# Patient Record
Sex: Female | Born: 1970 | Hispanic: No | Marital: Married | Smoking: Current some day smoker
Health system: Southern US, Community
[De-identification: ages and names within clinical notes are randomized; demographics above are authoritative.]

## PROBLEM LIST (undated history)

## (undated) ENCOUNTER — Inpatient Hospital Stay (HOSPITAL_COMMUNITY): Payer: Self-pay

## (undated) DIAGNOSIS — R519 Headache, unspecified: Secondary | ICD-10-CM

## (undated) DIAGNOSIS — G43909 Migraine, unspecified, not intractable, without status migrainosus: Secondary | ICD-10-CM

## (undated) DIAGNOSIS — R51 Headache: Secondary | ICD-10-CM

## (undated) DIAGNOSIS — M549 Dorsalgia, unspecified: Secondary | ICD-10-CM

## (undated) HISTORY — DX: Headache, unspecified: R51.9

## (undated) HISTORY — DX: Headache: R51

## (undated) HISTORY — PX: CHOLECYSTECTOMY: SHX55

---

## 2010-07-04 ENCOUNTER — Emergency Department (HOSPITAL_COMMUNITY)
Admission: EM | Admit: 2010-07-04 | Discharge: 2010-07-04 | Payer: Self-pay | Source: Home / Self Care | Admitting: Emergency Medicine

## 2010-10-03 LAB — CBC
Hemoglobin: 15.2 g/dL — ABNORMAL HIGH (ref 12.0–15.0)
MCH: 31.9 pg (ref 26.0–34.0)
MCHC: 33.3 g/dL (ref 30.0–36.0)
MCV: 95.8 fL (ref 78.0–100.0)
Platelets: 281 10*3/uL (ref 150–400)
RBC: 4.77 MIL/uL (ref 3.87–5.11)

## 2010-10-03 LAB — URINE MICROSCOPIC-ADD ON

## 2010-10-03 LAB — DIFFERENTIAL
Basophils Absolute: 0 10*3/uL (ref 0.0–0.1)
Basophils Relative: 0 % (ref 0–1)
Eosinophils Absolute: 0.1 10*3/uL (ref 0.0–0.7)
Lymphs Abs: 1.5 10*3/uL (ref 0.7–4.0)

## 2010-10-03 LAB — LIPASE, BLOOD: Lipase: 22 U/L (ref 11–59)

## 2010-10-03 LAB — POCT PREGNANCY, URINE: Preg Test, Ur: NEGATIVE

## 2010-10-03 LAB — URINALYSIS, ROUTINE W REFLEX MICROSCOPIC
Glucose, UA: NEGATIVE mg/dL
Urobilinogen, UA: 0.2 mg/dL (ref 0.0–1.0)

## 2010-10-03 LAB — COMPREHENSIVE METABOLIC PANEL
ALT: 18 U/L (ref 0–35)
Alkaline Phosphatase: 50 U/L (ref 39–117)
Chloride: 106 mEq/L (ref 96–112)
Creatinine, Ser: 0.75 mg/dL (ref 0.4–1.2)
GFR calc Af Amer: >60 mL/min (ref >60)
GFR calc non Af Amer: >60 mL/min (ref >60)
Potassium: 4.4 mEq/L (ref 3.5–5.1)

## 2010-10-03 LAB — URINE CULTURE
Colony Count: 60000
Culture  Setup Time: 201112140137

## 2010-10-07 ENCOUNTER — Inpatient Hospital Stay (HOSPITAL_COMMUNITY): Payer: Self-pay

## 2010-10-07 ENCOUNTER — Inpatient Hospital Stay (HOSPITAL_COMMUNITY)
Admission: AD | Admit: 2010-10-07 | Discharge: 2010-10-07 | Disposition: A | Discharge status: Home / Self Care | Payer: Self-pay | Source: Ambulatory Visit | Attending: Obstetrics & Gynecology | Admitting: Obstetrics & Gynecology

## 2010-10-07 DIAGNOSIS — O99891 Other specified diseases and conditions complicating pregnancy: Secondary | ICD-10-CM | POA: Insufficient documentation

## 2010-10-07 DIAGNOSIS — R109 Unspecified abdominal pain: Secondary | ICD-10-CM

## 2010-10-07 DIAGNOSIS — O9989 Other specified diseases and conditions complicating pregnancy, childbirth and the puerperium: Secondary | ICD-10-CM

## 2010-10-07 LAB — CBC: Hemoglobin: 14.2 g/dL (ref 12.0–15.0)

## 2010-10-07 LAB — URINE MICROSCOPIC-ADD ON

## 2010-10-07 LAB — WET PREP, GENITAL: Clue Cells Wet Prep HPF POC: NONE SEEN

## 2010-10-07 LAB — URINALYSIS, ROUTINE W REFLEX MICROSCOPIC
Glucose, UA: NEGATIVE mg/dL
Ketones, ur: NEGATIVE mg/dL
Protein, ur: NEGATIVE mg/dL
Specific Gravity, Urine: 1.02 (ref 1.005–1.030)
Urobilinogen, UA: 0.2 mg/dL (ref 0.0–1.0)
pH: 6 (ref 5.0–8.0)

## 2010-10-08 LAB — URINE CULTURE
Colony Count: 55000
Culture  Setup Time: 201203171746

## 2010-10-09 ENCOUNTER — Inpatient Hospital Stay (HOSPITAL_COMMUNITY)
Admission: AD | Admit: 2010-10-09 | Discharge: 2010-10-09 | Disposition: A | Discharge status: Home / Self Care | Payer: Self-pay | Source: Ambulatory Visit | Attending: Obstetrics and Gynecology | Admitting: Obstetrics and Gynecology

## 2010-10-09 DIAGNOSIS — O99891 Other specified diseases and conditions complicating pregnancy: Secondary | ICD-10-CM | POA: Insufficient documentation

## 2010-10-09 DIAGNOSIS — O26899 Other specified pregnancy related conditions, unspecified trimester: Secondary | ICD-10-CM

## 2010-10-09 DIAGNOSIS — R109 Unspecified abdominal pain: Secondary | ICD-10-CM | POA: Insufficient documentation

## 2010-10-09 LAB — HCG, QUANTITATIVE, PREGNANCY: hCG, Beta Chain, Quant, S: 1570 m[IU]/mL — ABNORMAL HIGH (ref <5)

## 2010-10-11 LAB — GC/CHLAMYDIA PROBE AMP, GENITAL: Chlamydia, DNA Probe: NEGATIVE

## 2010-10-16 ENCOUNTER — Ambulatory Visit (HOSPITAL_COMMUNITY)
Admit: 2010-10-16 | Discharge: 2010-10-16 | Disposition: A | Discharge status: Home / Self Care | Payer: Self-pay | Attending: Obstetrics and Gynecology | Admitting: Obstetrics and Gynecology

## 2010-10-16 ENCOUNTER — Other Ambulatory Visit (HOSPITAL_COMMUNITY): Payer: Self-pay | Admitting: Obstetrics and Gynecology

## 2010-10-16 ENCOUNTER — Inpatient Hospital Stay (HOSPITAL_COMMUNITY)
Admission: AD | Admit: 2010-10-16 | Discharge: 2010-10-16 | Disposition: A | Discharge status: Home / Self Care | Payer: Self-pay | Source: Ambulatory Visit | Attending: Obstetrics & Gynecology | Admitting: Obstetrics & Gynecology

## 2010-10-16 DIAGNOSIS — O26899 Other specified pregnancy related conditions, unspecified trimester: Secondary | ICD-10-CM

## 2010-10-16 DIAGNOSIS — O09529 Supervision of elderly multigravida, unspecified trimester: Secondary | ICD-10-CM | POA: Insufficient documentation

## 2010-10-16 DIAGNOSIS — R109 Unspecified abdominal pain: Secondary | ICD-10-CM

## 2010-10-16 DIAGNOSIS — Z3689 Encounter for other specified antenatal screening: Secondary | ICD-10-CM | POA: Insufficient documentation

## 2010-10-16 DIAGNOSIS — O99891 Other specified diseases and conditions complicating pregnancy: Secondary | ICD-10-CM | POA: Insufficient documentation

## 2010-10-16 DIAGNOSIS — O9989 Other specified diseases and conditions complicating pregnancy, childbirth and the puerperium: Secondary | ICD-10-CM

## 2010-11-23 ENCOUNTER — Encounter (HOSPITAL_COMMUNITY): Payer: Self-pay

## 2010-11-23 ENCOUNTER — Other Ambulatory Visit (HOSPITAL_COMMUNITY): Payer: Self-pay | Admitting: Obstetrics

## 2010-11-23 ENCOUNTER — Ambulatory Visit (HOSPITAL_COMMUNITY)
Admission: RE | Admit: 2010-11-23 | Discharge: 2010-11-23 | Disposition: A | Discharge status: Home / Self Care | Payer: Self-pay | Source: Ambulatory Visit | Attending: Obstetrics | Admitting: Obstetrics

## 2010-11-23 DIAGNOSIS — O3680X0 Pregnancy with inconclusive fetal viability, not applicable or unspecified: Secondary | ICD-10-CM

## 2010-11-23 DIAGNOSIS — O368390 Maternal care for abnormalities of the fetal heart rate or rhythm, unspecified trimester, not applicable or unspecified: Secondary | ICD-10-CM | POA: Insufficient documentation

## 2010-11-23 DIAGNOSIS — Z3689 Encounter for other specified antenatal screening: Secondary | ICD-10-CM | POA: Insufficient documentation

## 2010-12-07 ENCOUNTER — Other Ambulatory Visit (HOSPITAL_COMMUNITY): Payer: Self-pay | Admitting: Obstetrics

## 2010-12-08 ENCOUNTER — Encounter (HOSPITAL_COMMUNITY): Payer: Self-pay

## 2010-12-12 ENCOUNTER — Ambulatory Visit (HOSPITAL_COMMUNITY)
Admission: RE | Admit: 2010-12-12 | Discharge: 2010-12-12 | Disposition: A | Discharge status: Home / Self Care | Payer: Self-pay | Source: Ambulatory Visit | Attending: Obstetrics | Admitting: Obstetrics

## 2010-12-12 ENCOUNTER — Other Ambulatory Visit (HOSPITAL_COMMUNITY): Payer: Self-pay | Admitting: Obstetrics

## 2010-12-12 DIAGNOSIS — Z0489 Encounter for examination and observation for other specified reasons: Secondary | ICD-10-CM

## 2010-12-12 DIAGNOSIS — IMO0002 Reserved for concepts with insufficient information to code with codable children: Secondary | ICD-10-CM

## 2011-01-09 ENCOUNTER — Ambulatory Visit (HOSPITAL_COMMUNITY)
Admission: RE | Admit: 2011-01-09 | Discharge: 2011-01-09 | Disposition: A | Discharge status: Home / Self Care | Payer: Self-pay | Source: Ambulatory Visit | Attending: Obstetrics | Admitting: Obstetrics

## 2011-01-09 ENCOUNTER — Other Ambulatory Visit (HOSPITAL_COMMUNITY): Payer: Self-pay | Admitting: Obstetrics

## 2011-01-09 DIAGNOSIS — IMO0002 Reserved for concepts with insufficient information to code with codable children: Secondary | ICD-10-CM

## 2011-01-09 DIAGNOSIS — O09529 Supervision of elderly multigravida, unspecified trimester: Secondary | ICD-10-CM | POA: Insufficient documentation

## 2011-01-09 DIAGNOSIS — Z363 Encounter for antenatal screening for malformations: Secondary | ICD-10-CM | POA: Insufficient documentation

## 2011-01-09 DIAGNOSIS — Z0489 Encounter for examination and observation for other specified reasons: Secondary | ICD-10-CM

## 2011-01-09 DIAGNOSIS — Z1389 Encounter for screening for other disorder: Secondary | ICD-10-CM | POA: Insufficient documentation

## 2011-01-09 DIAGNOSIS — O99330 Smoking (tobacco) complicating pregnancy, unspecified trimester: Secondary | ICD-10-CM | POA: Insufficient documentation

## 2011-01-09 DIAGNOSIS — O34219 Maternal care for unspecified type scar from previous cesarean delivery: Secondary | ICD-10-CM | POA: Insufficient documentation

## 2011-01-09 DIAGNOSIS — O358XX0 Maternal care for other (suspected) fetal abnormality and damage, not applicable or unspecified: Secondary | ICD-10-CM | POA: Insufficient documentation

## 2011-02-06 ENCOUNTER — Ambulatory Visit (HOSPITAL_COMMUNITY)
Admission: RE | Admit: 2011-02-06 | Discharge: 2011-02-06 | Disposition: A | Discharge status: Home / Self Care | Payer: Self-pay | Source: Ambulatory Visit | Attending: Obstetrics | Admitting: Obstetrics

## 2011-02-06 ENCOUNTER — Encounter (HOSPITAL_COMMUNITY): Payer: Self-pay

## 2011-02-06 DIAGNOSIS — O34219 Maternal care for unspecified type scar from previous cesarean delivery: Secondary | ICD-10-CM | POA: Insufficient documentation

## 2011-02-06 DIAGNOSIS — O09529 Supervision of elderly multigravida, unspecified trimester: Secondary | ICD-10-CM | POA: Insufficient documentation

## 2011-02-06 DIAGNOSIS — Z363 Encounter for antenatal screening for malformations: Secondary | ICD-10-CM | POA: Insufficient documentation

## 2011-02-06 DIAGNOSIS — Z1389 Encounter for screening for other disorder: Secondary | ICD-10-CM | POA: Insufficient documentation

## 2011-02-06 DIAGNOSIS — Z0489 Encounter for examination and observation for other specified reasons: Secondary | ICD-10-CM

## 2011-02-06 DIAGNOSIS — O358XX0 Maternal care for other (suspected) fetal abnormality and damage, not applicable or unspecified: Secondary | ICD-10-CM | POA: Insufficient documentation

## 2011-02-06 DIAGNOSIS — IMO0002 Reserved for concepts with insufficient information to code with codable children: Secondary | ICD-10-CM

## 2011-02-06 DIAGNOSIS — O99330 Smoking (tobacco) complicating pregnancy, unspecified trimester: Secondary | ICD-10-CM | POA: Insufficient documentation

## 2011-09-06 ENCOUNTER — Encounter (HOSPITAL_COMMUNITY): Payer: Self-pay

## 2012-07-14 IMAGING — CT CT ABD-PELV W/ CM
1 series · 15 of 32 positions shown, 19 images · IV contrast (agent unspecified)
Comparison: None

CLINICAL DATA: Vomiting.  Stomach pain and burning.

CT ABDOMEN AND PELVIS WITH CONTRAST
TECHNIQUE: Multidetector CT imaging of the abdomen and pelvis was
performed following the standard protocol during bolus
administration of intravenous contrast.
Contrast: 100 ml of omni 300

[Series 2: rtn ap with st · axial · 0.65mm/px · z∈[-458,-32]mm · 15 of 96 slices shown, 19 images]
[im 7/96  soft-tissue]
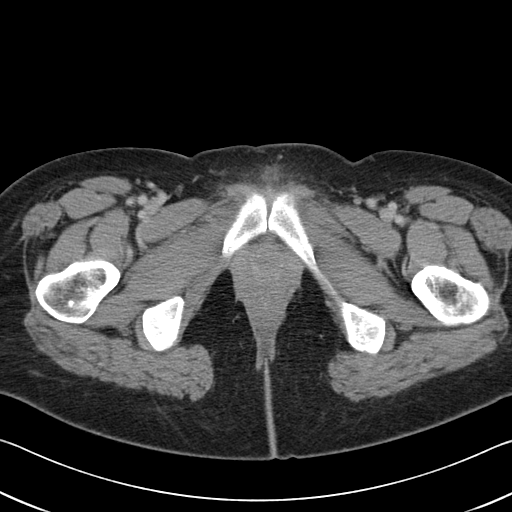
[im 7/96  bone]
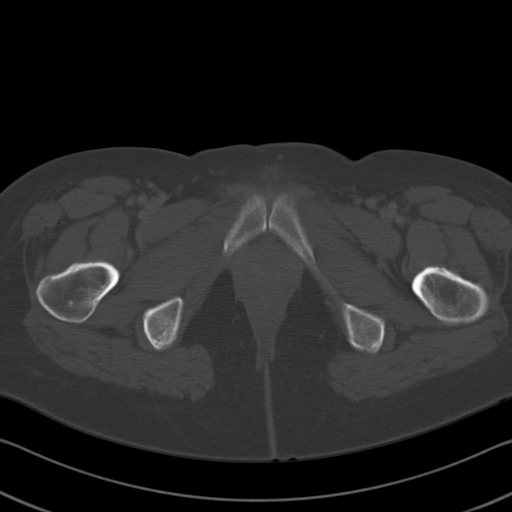
[im 13/96  soft-tissue]
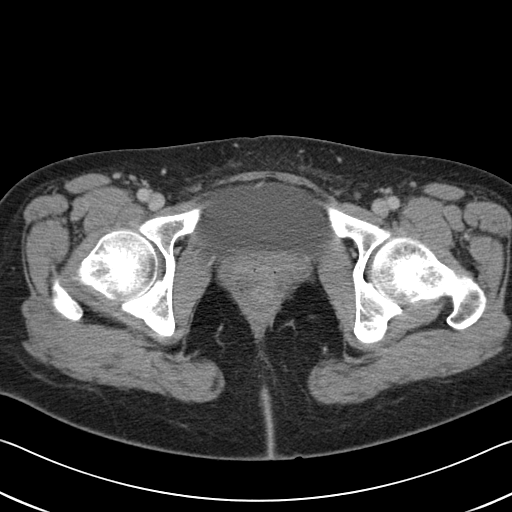
[im 19/96  soft-tissue]
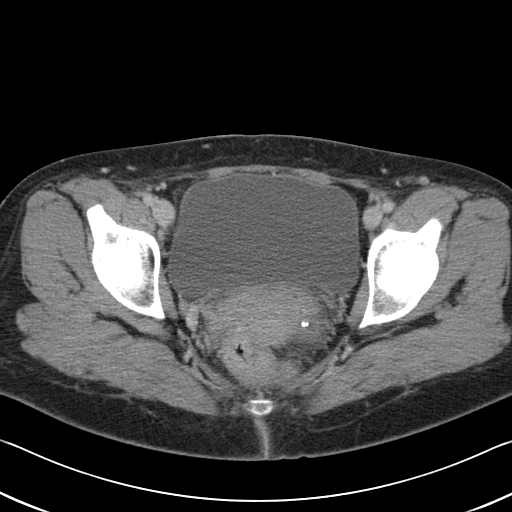
[im 28/96  soft-tissue]
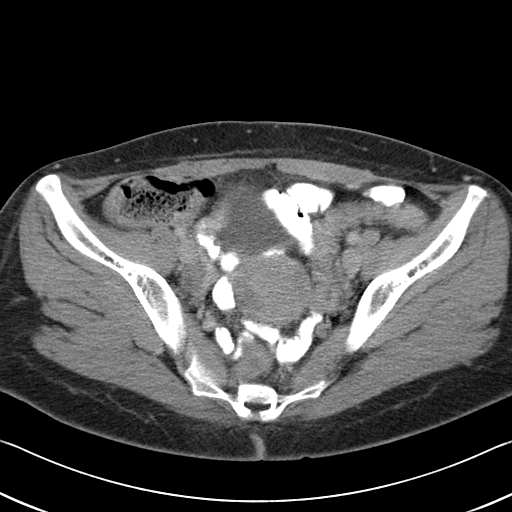
[im 34/96  soft-tissue]
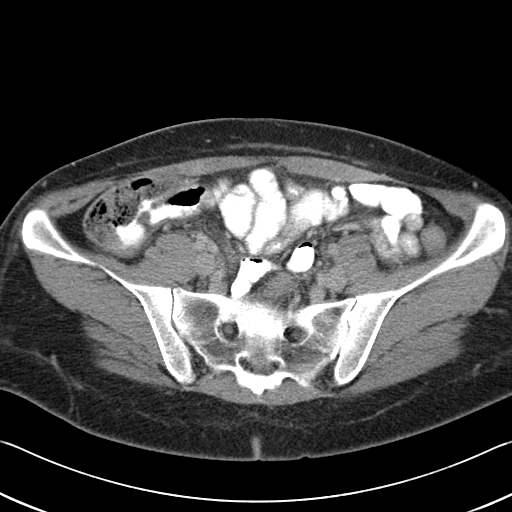
[im 40/96  soft-tissue]
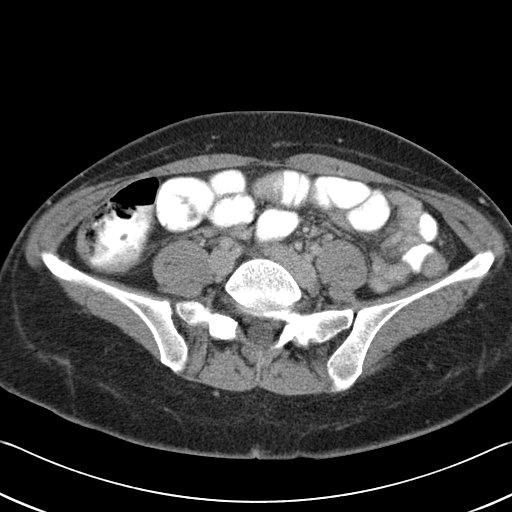
[im 50/96  soft-tissue]
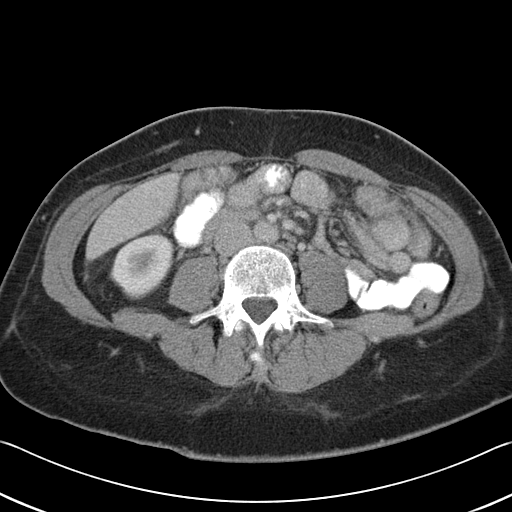
[im 56/96  soft-tissue]
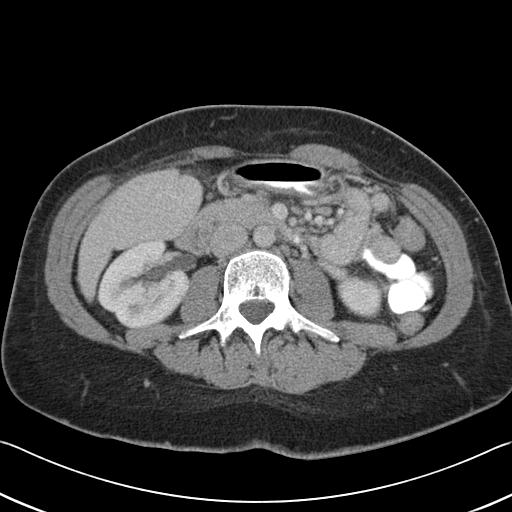
[im 62/96  soft-tissue]
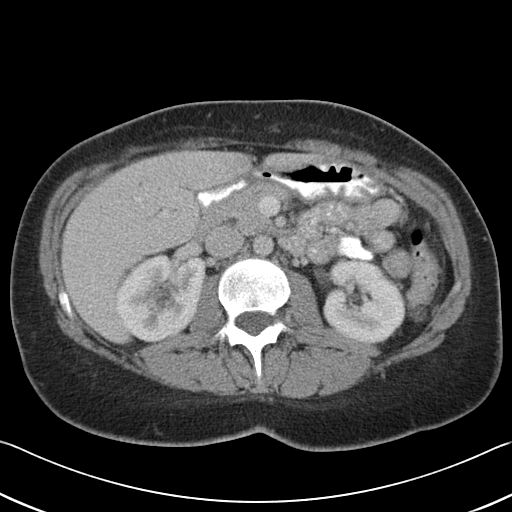
[im 62/96  bone]
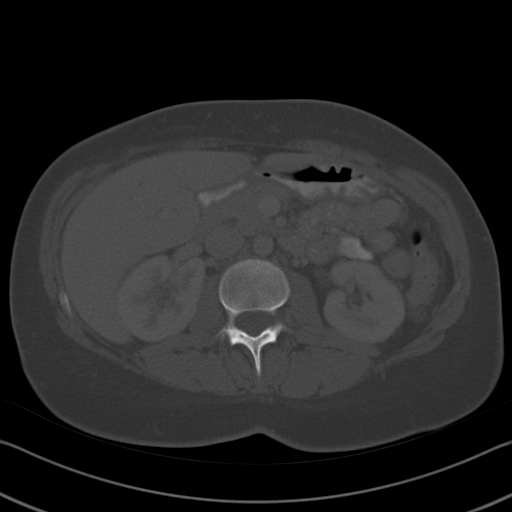
[im 68/96  soft-tissue]
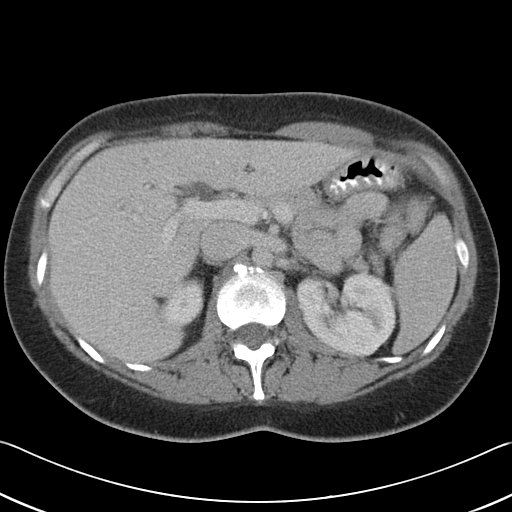
[im 77/96  soft-tissue]
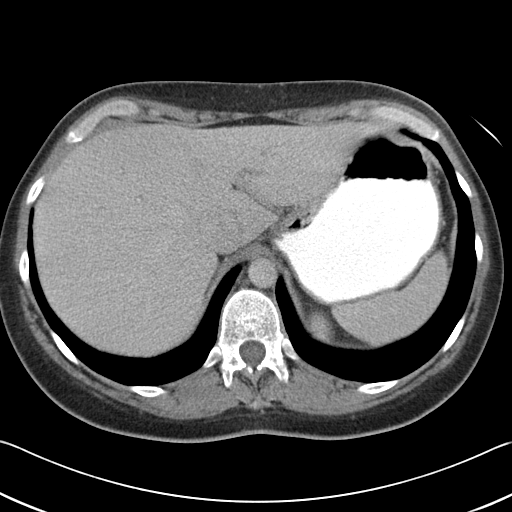
[im 83/96  soft-tissue]
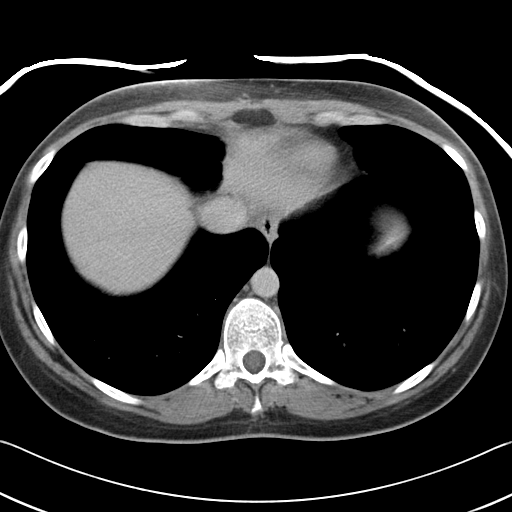
[im 83/96  lung]
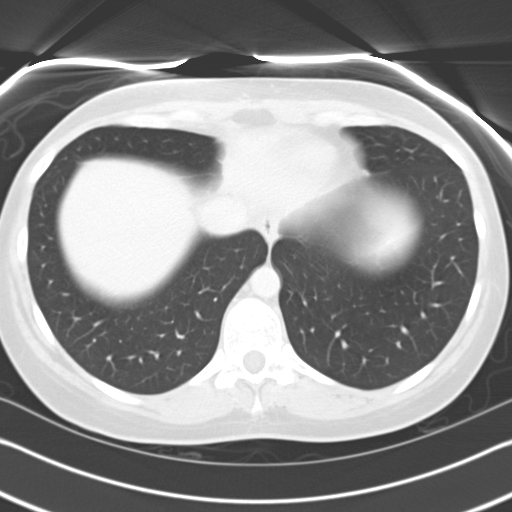
[im 86/96  lung]
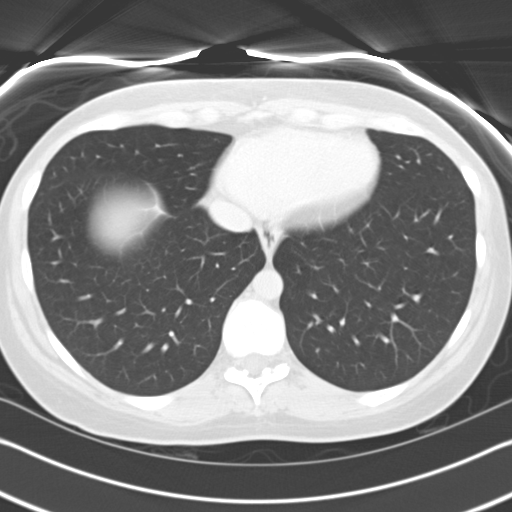
[im 89/96  soft-tissue]
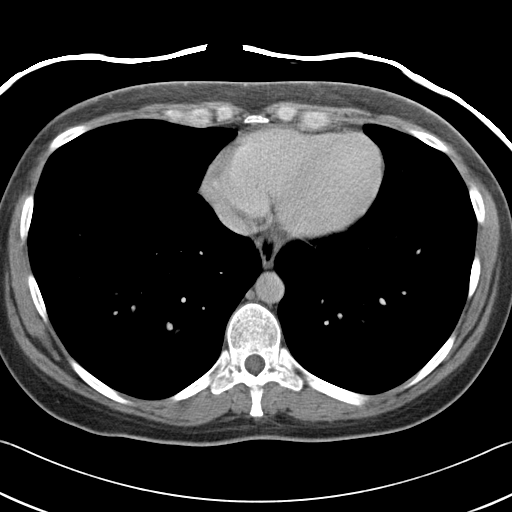
[im 89/96  lung]
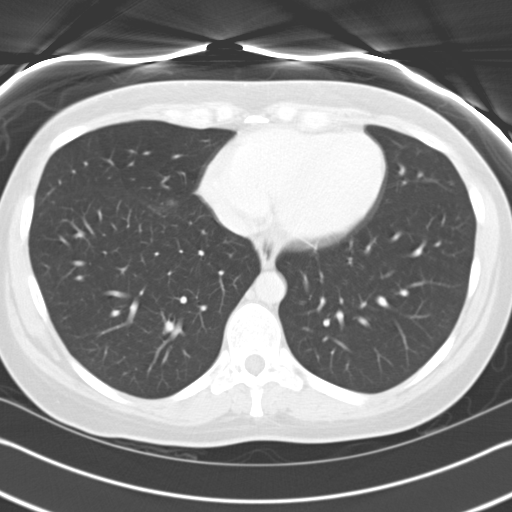
[im 92/96  lung]
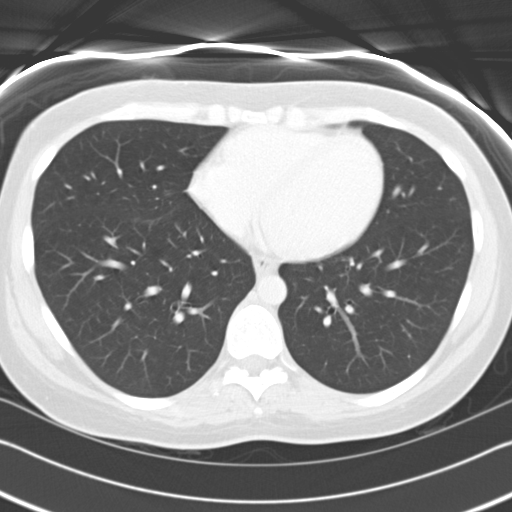

[15 of 32 positions shown; findings below may reference images not displayed]

FINDINGS: The lung bases are clear.  The spleen appears normal.  The adrenal
glands are normal.  There is no focal pancreatic abnormality.  The
patient is status post cholecystectomy.

There is mild intrahepatic biliary dilatation.  The common bile
duct measures up to 7 mm.  There is no pancreatic duct dilatation.
The pancreas appears normal.

The kidneys both appear normal.

There is no free fluid or fluid collections within the abdomen or
pelvis.

There are no enlarged lymph nodes within the upper abdomen.

There is no pelvic or inguinal adenopathy identified.

The appendix is identified and appears normal.  No peri appendiceal
fat stranding identified.  The stomach and small bowel loops appear
normal.  The colon is unremarkable.

The urinary bladder appears normal.  The uterus and adnexal
structures are unremarkable.
IMPRESSION: 1.  There is mild increased caliber of the common bile duct and
intrahepatic biliary ducts.
2.  Examination is otherwise unremarkable.

## 2013-04-06 ENCOUNTER — Ambulatory Visit: Payer: Self-pay | Admitting: Internal Medicine

## 2013-04-06 VITALS — BP 104/78 | HR 79 | Temp 98.2°F | Resp 18 | Ht 68.0 in | Wt 201.2 lb

## 2013-04-06 DIAGNOSIS — A6009 Herpesviral infection of other urogenital tract: Secondary | ICD-10-CM

## 2013-04-06 DIAGNOSIS — Z209 Contact with and (suspected) exposure to unspecified communicable disease: Secondary | ICD-10-CM

## 2013-04-06 DIAGNOSIS — M549 Dorsalgia, unspecified: Secondary | ICD-10-CM

## 2013-04-06 DIAGNOSIS — N920 Excessive and frequent menstruation with regular cycle: Secondary | ICD-10-CM

## 2013-04-06 DIAGNOSIS — N926 Irregular menstruation, unspecified: Secondary | ICD-10-CM

## 2013-04-06 LAB — POCT CBC
HCT, POC: 45.8 % (ref 37.7–47.9)
Hemoglobin: 14.7 g/dL (ref 12.2–16.2)
Lymph, poc: 2.4 (ref 0.6–3.4)
MCH, POC: 31.6 pg — AB (ref 27–31.2)
MCHC: 32.1 g/dL (ref 31.8–35.4)
WBC: 7.3 10*3/uL (ref 4.6–10.2)

## 2013-04-06 LAB — POCT UA - MICROSCOPIC ONLY
Bacteria, U Microscopic: NEGATIVE
Casts, Ur, LPF, POC: NEGATIVE
Mucus, UA: NEGATIVE

## 2013-04-06 LAB — POCT URINALYSIS DIPSTICK
Bilirubin, UA: NEGATIVE
Glucose, UA: NEGATIVE
Ketones, UA: NEGATIVE
Leukocytes, UA: NEGATIVE
pH, UA: 8

## 2013-04-06 MED ORDER — VALACYCLOVIR HCL 1 G PO TABS: ORAL_TABLET | ORAL | Status: DC

## 2013-04-06 MED ORDER — MELOXICAM 15 MG PO TABS: 15.0000 mg | ORAL_TABLET | Freq: Every day | ORAL | Status: DC

## 2013-04-06 NOTE — Patient Instructions (Signed)
Herniated Disk The bones of your spinal column (vertebrae) protect your spinal cord and nerves that go into your arms and legs. The vertebrae are separated by disks that cushion the spinal column and put space between your vertebrae. This allows movement between the vertebrae, which allows you to bend, rotate, and move your body from side to side. Sometimes, the disks move out of place (herniate) or break open (rupture) from injury or strain. The most common area for a disk herniation is in the lower back (lumbar area). Sometimes herniation occurs in the neck (cervical) disks.  CAUSES  As we grow older, the strong, fibrous cords that connect the vertebrae and support and surround the disks (ligaments) start to weaken. A strain on the back may cause a break in the disk ligaments. RISK FACTORS Herniated disks occur most often in men who are aged 18 years to 35 years, usually after strenuous activity. Other risk factors include conditions present at birth (congenital) that affect the size of the lumbar spinal canal. Additionally, a narrowing of the areas where the nerves exit the spinal canal can occur as you age. SYMPTOMS  Symptoms of a herniated disk vary. You may have weakness in certain muscles. This weakness can include difficulty lifting your leg or arm, difficulty standing on your toes on one side, or difficulty squeezing tightly with one of your hands. You may have numbness. You may feel a mild tingling, dull ache, or a burning or pulsating pain. In some cases, the pain is severe enough that you are unable to move. The pain most often occurs on one side of the body. The pain often starts slowly. It may get worse:  After you sit or stand.  At night.  When you sneeze, cough, or laugh.  When you bend backwards or walk more than a few yards. The pain, numbness, or weakness will often go away or improve a lot over a period of weeks to months. Herniated lumbar disk Symptoms of a herniated lumbar  disk may include sharp pain in one part of your leg, hip, or buttocks and numbness in other parts. You also may feel pain or numbness on the back of your calf or the top or sole of your foot. The same leg also may feel weak. Herniated cervical disk Symptoms of a herniated cervical disk may include pain when you move your neck, deep pain near or over your shoulder blade, or pain that moves to your upper arm, forearm, or fingers. DIAGNOSIS  To diagnose a herniated disk, your caregiver will perform a physical exam. Your caregiver also may perform diagnostic tests to see your disk or to test the reaction of your muscles and the function of your nerves. During the physical exam, your caregiver may ask you to:  Sit, stand, and walk. While you walk, your caregiver may ask you to try walking on your toes and then your heels.  Bend forward, backward, and sideways.  Raise your shoulders, elbow, wrist, and fingers and check your strength during these tasks. Your caregiver will check for:  Numbness or loss of feeling.  Muscle reflexes, which may be slower or missing.  Muscle strength, which may be weaker.  Posture or the way your spine curves. Diagnostic tests that may be done include:  A spinal X-ray exam to rule out other causes of back pain.  Magnetic resonance imaging (MRI) or computed tomography (CT) scan, which will show if the herniated disk is pressing on your spinal canal.  Electromyography.   This is sometimes used to identify the specific area of nerve involvement. TREATMENT  Initial treatment for a herniated disk is a short period of rest with medicines for pain. Pain medicines can include nonsteroidal anti-inflammatory medicines (NSAIDs), muscle relaxants for back spasms, and (rarely) narcotic pain medicine for severe pain that does not respond to NSAID use. Bed rest is often limited to 1 or 2 days at the most because prolonged rest can delay recovery. When the herniation involves the  lower back, sitting should be avoided as much as possible because sitting increases pressure on the ruptured disk. Sometimes a soft neck collar will be prescribed for a few days to weeks to help support your neck in the case of a cervical herniation. Physical therapy is often prescribed for patients with disk disease. Physical therapists will teach you how to properly lift, dress, walk, and perform other activities. They will work on strengthening the muscles that help support your spine. In some cases, physical therapy alone is not enough to treat a herniated disk. Steroid injections along the involved nerve root may be needed to help control pain. The steroid is injected in the area of the herniated disk and helps by reducing swelling around the disk. Sometimes surgery is the best option to treat a herniated disk.  SEEK IMMEDIATE MEDICAL CARE IF:   You have numbness, tingling, weakness, or problems with the use of your arms or legs.  You have severe headaches that are not relieved with the use of medicines.  You notice a change in your bowel or bladder control.  You have increasing pain in any areas of your body.  You experience shortness of breath, dizziness, or fainting. MAKE SURE YOU:   Understand these instructions.  Will watch your condition.  Will get help right away if you are not doing well or get worse. Document Released: 07/06/2000 Document Revised: 10/01/2011 Document Reviewed: 02/09/2011 [employer] Patient Information 2014 [employer].  

## 2013-04-06 NOTE — Progress Notes (Signed)
Subjective:    Patient ID: Connie Bryan, female    DOB: 24-Aug-1970, 42 y.o.   MRN: [id number]  HPI Has had irregular periods for 2 months. Is having increasing pelvic pain after periods. Feels nauseous and dizzy. Period 5-6 days. Goes through 1 pad every hour for first two days then 3-4 pads max.   Has never had irregular periods. Has never Bryan on any hormones.  Has felt hot. Occasional chills.   Has gained weight over the lat two years since childbirth, unable to exercise due to herniated disk.  Takes tramadol, has not given her any relief for abdominal pain.  No hematuria, dysuria, urgency. Has some frequency but attributes this to fluid intake.  Has BM every 1-2 days. Has hard stools that require straining. Uses herbal teas with good relief.  Patient has Bryan in [place] for two years, returning to [place] 1 month ago. Her husband was not with her for 9 months and she is concerned that he has had other sexual partners. One month ago she had a rash with blisters on her genital area. This was so painful she could not sit down.  Pain with intercourse. Review of Systems Had some leftover antibiotics for a UTI, took some, not sure what they were. No relief of her symptoms.    Objective:   Physical Exam  Constitutional: She appears well-developed and well-nourished.  NAD No cva tend abd-nontend Pelvic exam- external genitalia without lesions. Cervix with small amount thin white discharge, no blood, not friable. Tender with speculum insertion. Swab obtained. No CMT. Painful to palpation over ovaries bilaterally. No masses.  Recent Results (from the past 2160 hour(s))  POCT CBC     Status: Abnormal   Collection Time    04/06/13  7:53 PM      Result Value Range   WBC 7.3  4.6 - 10.2 K/uL   Lymph, poc 2.4  0.6 - 3.4   POC LYMPH PERCENT 32.3  10 - 50 %L   MID (cbc) 0.5  0 - 0.9   POC MID % 6.6  0 - 12 %M   POC Granulocyte 4.5  2 - 6.9   Granulocyte percent 61.1  37 - 80 %G   RBC  4.65  4.04 - 5.48 M/uL   Hemoglobin 14.7  12.2 - 16.2 g/dL   HCT, POC 40.9  81.1 - 47.9 %   MCV 98.6 (*) 80 - 97 fL   MCH, POC 31.6 (*) 27 - 31.2 pg   MCHC 32.1  31.8 - 35.4 g/dL   RDW, POC 91.4     Platelet Count, POC 316  142 - 424 K/uL   MPV 9.3  0 - 99.8 fL  POCT URINALYSIS DIPSTICK     Status: None   Collection Time    04/06/13  7:53 PM      Result Value Range   Color, UA yellow     Clarity, UA clear     Glucose, UA neg     Bilirubin, UA neg     Ketones, UA neg     Spec Grav, UA 1.015     Blood, UA mod     pH, UA 8.0     Protein, UA neg     Urobilinogen, UA 0.2     Nitrite, UA neg     Leukocytes, UA Negative    POCT UA - MICROSCOPIC ONLY     Status: None   Collection Time    04/06/13  7:54  PM      Result Value Range   WBC, Ur, HPF, POC neg     RBC, urine, microscopic 4-8     Bacteria, U Microscopic neg     Mucus, UA neg     Epithelial cells, urine per micros 4 - TNTC     Crystals, Ur, HPF, POC neg     Casts, Ur, LPF, POC neg     Yeast, UA neg           Assessment & Plan:  Menorrhagia - Plan: POCT CBC, POCT urinalysis dipstick, POCT UA - Microscopic Only, GC/Chlamydia Probe Amp, HIV antibody, RPR  Exposure to communicable disease - Plan: POCT CBC, POCT urinalysis dipstick, POCT UA - Microscopic Only, GC/Chlamydia Probe Amp, HIV antibody, RPR  Recent HSV #1 episode  Irregular menstrual cycle - Plan: POCT CBC, POCT urinalysis dipstick, POCT UA - Microscopic Only, GC/Chlamydia Probe Amp, HIV antibody, RPR  Chronic back pain  Meds ordered this encounter  Medications  . valACYclovir (VALTREX) 1000 MG tablet    Sig: Take twice a day for 3 days when symptoms appear.    Dispense:  6 tablet    Refill:  3  . meloxicam (MOBIC) 15 MG tablet    Sig: Take 1 tablet (15 mg total) by mouth daily.    Dispense:  30 tablet    Refill:  1   1- menorrhagia- check labs as above 2- Exposure to communicable disease- check labs as above 3- Herpes Simplex- Valtrex 100 mg po  BID at first signs of symptoms. 4-  Back pain- discussed Ultram use. Patient reports taking 2 tablets 3 times a day. Will not provide prescription at this time. Discussed physical therapy, short term prednisone use, referral to orthopedic surgery. Patient will think about this. Will try meloxicam 15 mg po qd. Provided information about herniated disk to patient. 5- Discussed CBC/UA results with patient. Will call with other results.    I fully participated in this clinical visit. I have reviewed and agree with documentation. Robert P. Merla Riches, M.D.

## 2013-04-07 LAB — HIV ANTIBODY (ROUTINE TESTING W REFLEX): HIV: NONREACTIVE

## 2013-04-07 LAB — RPR

## 2013-04-09 ENCOUNTER — Telehealth: Payer: Self-pay

## 2013-04-09 NOTE — Telephone Encounter (Signed)
Patient is calling about her lab results. She is very anxious.  [phone]

## 2013-04-09 NOTE — Telephone Encounter (Signed)
Called patient no answer. Lab has called also, left message.

## 2013-04-09 NOTE — Telephone Encounter (Signed)
Patient has called again inquiring about her lab tests. [phone].

## 2013-04-11 ENCOUNTER — Encounter: Payer: Self-pay | Admitting: *Deleted

## 2013-04-13 ENCOUNTER — Telehealth: Payer: Self-pay

## 2013-04-13 NOTE — Telephone Encounter (Signed)
Pt returned calls and notified of normal labs

## 2013-06-06 ENCOUNTER — Emergency Department (HOSPITAL_COMMUNITY)
Admission: EM | Admit: 2013-06-06 | Discharge: 2013-06-06 | Discharge status: Against Medical Advice | Payer: Medicaid Other | Attending: Emergency Medicine | Admitting: Emergency Medicine

## 2013-06-06 ENCOUNTER — Encounter (HOSPITAL_COMMUNITY): Payer: Self-pay | Admitting: Emergency Medicine

## 2013-06-06 DIAGNOSIS — R51 Headache: Secondary | ICD-10-CM | POA: Insufficient documentation

## 2013-06-06 DIAGNOSIS — R519 Headache, unspecified: Secondary | ICD-10-CM

## 2013-06-06 DIAGNOSIS — R112 Nausea with vomiting, unspecified: Secondary | ICD-10-CM | POA: Insufficient documentation

## 2013-06-06 DIAGNOSIS — F17200 Nicotine dependence, unspecified, uncomplicated: Secondary | ICD-10-CM | POA: Insufficient documentation

## 2013-06-06 DIAGNOSIS — Z791 Long term (current) use of non-steroidal anti-inflammatories (NSAID): Secondary | ICD-10-CM | POA: Insufficient documentation

## 2013-06-06 DIAGNOSIS — Z79899 Other long term (current) drug therapy: Secondary | ICD-10-CM | POA: Insufficient documentation

## 2013-06-06 LAB — COMPREHENSIVE METABOLIC PANEL
ALT: 19 U/L (ref 0–35)
Alkaline Phosphatase: 57 U/L (ref 39–117)
BUN: 14 mg/dL (ref 6–23)
CO2: 26 mEq/L (ref 19–32)
Chloride: 99 mEq/L (ref 96–112)
GFR calc Af Amer: >90 mL/min (ref >90)
Glucose, Bld: 127 mg/dL — ABNORMAL HIGH (ref 70–99)
Potassium: 4.2 mEq/L (ref 3.5–5.1)
Sodium: 136 mEq/L (ref 135–145)
Total Bilirubin: 0.4 mg/dL (ref 0.3–1.2)

## 2013-06-06 LAB — CBC WITH DIFFERENTIAL/PLATELET
Hemoglobin: 14.4 g/dL (ref 12.0–15.0)
Lymphocytes Relative: 28 % (ref 12–46)
Lymphs Abs: 3.4 10*3/uL (ref 0.7–4.0)
MCH: 32 pg (ref 26.0–34.0)
Monocytes Relative: 7 % (ref 3–12)
Neutro Abs: 7.6 10*3/uL (ref 1.7–7.7)
Neutrophils Relative %: 64 % (ref 43–77)
Platelets: 318 10*3/uL (ref 150–400)
RBC: 4.5 MIL/uL (ref 3.87–5.11)
WBC: 12 10*3/uL — ABNORMAL HIGH (ref 4.0–10.5)

## 2013-06-06 LAB — LIPASE, BLOOD: Lipase: 20 U/L (ref 11–59)

## 2013-06-06 MED ORDER — METOCLOPRAMIDE HCL 5 MG/ML IJ SOLN
10.0000 mg | Freq: Once | INTRAMUSCULAR | Status: AC
  Administered 2013-06-06: 10 mg via INTRAVENOUS
  Filled 2013-06-06: qty 2

## 2013-06-06 MED ORDER — ONDANSETRON HCL 4 MG/2ML IJ SOLN
4.0000 mg | Freq: Once | INTRAMUSCULAR | Status: AC
  Administered 2013-06-06: 4 mg via INTRAVENOUS
  Filled 2013-06-06: qty 2

## 2013-06-06 MED ORDER — DEXAMETHASONE SODIUM PHOSPHATE 10 MG/ML IJ SOLN
10.0000 mg | Freq: Once | INTRAMUSCULAR | Status: AC
  Administered 2013-06-06: 10 mg via INTRAVENOUS
  Filled 2013-06-06: qty 1

## 2013-06-06 MED ORDER — HYDROMORPHONE HCL PF 1 MG/ML IJ SOLN
1.0000 mg | Freq: Once | INTRAMUSCULAR | Status: AC
  Administered 2013-06-06: 1 mg via INTRAVENOUS
  Filled 2013-06-06: qty 1

## 2013-06-06 MED ORDER — HYDROMORPHONE HCL PF 1 MG/ML IJ SOLN: 1.0000 mg | Freq: Once | INTRAMUSCULAR | Status: DC

## 2013-06-06 MED ORDER — DIPHENHYDRAMINE HCL 50 MG/ML IJ SOLN
25.0000 mg | Freq: Once | INTRAMUSCULAR | Status: AC
  Administered 2013-06-06: 25 mg via INTRAVENOUS
  Filled 2013-06-06: qty 1

## 2013-06-06 NOTE — ED Notes (Addendum)
Pt refused CT scan.  Pain to head 7/10 pain scale. Will advise EDP.

## 2013-06-06 NOTE — ED Notes (Signed)
To room pt c/o epigastric pain and vomiting.  Onset 1 hour ago.

## 2013-06-06 NOTE — ED Provider Notes (Signed)
CSN: [id number]     Arrival date & time 06/06/13  1414 History   First MD Initiated Contact with Patient 06/06/13 1438     Chief Complaint  Patient presents with  . Abdominal Pain  . Emesis   (Consider location/radiation/quality/duration/timing/severity/associated sxs/prior Treatment) Patient is a 42 y.o. female presenting with abdominal pain and vomiting. The history is provided by the patient and the spouse. No language interpreter was used.  Abdominal Pain Pain location:  Generalized Pain quality: sharp and stabbing   Pain radiates to:  Epigastric region Pain severity:  Severe Duration:  1 hour Timing:  Constant Progression:  Worsening Relieved by:  Nothing Worsened by:  Nothing tried Ineffective treatments:  None tried Associated symptoms: nausea and vomiting   Emesis Associated symptoms: abdominal pain   Pt complains of getting a headache.  Pt report she vomited and now has severe pain in abdomen.    History reviewed. No pertinent past medical history. Past Surgical History  Procedure Laterality Date  . Cesarean section    . Cholecystectomy     History reviewed. No pertinent family history. History  Substance Use Topics  . Smoking status: Current Every Day Smoker    Types: Cigarettes  . Smokeless tobacco: Never Used  . Alcohol Use: Yes     Comment: occasional   OB History   Grav Para Term Preterm Abortions TAB SAB Ect Mult Living   4 3 3  0 0 0 0 0 0 3     Review of Systems  Gastrointestinal: Positive for nausea, vomiting and abdominal pain.  All other systems reviewed and are negative.    Allergies  Review of patient's allergies indicates no known allergies.  Home Medications   Current Outpatient Rx  Name  Route  Sig  Dispense  Refill  . acetaminophen-codeine (TYLENOL #3) 300-30 MG per tablet   Oral   Take 1 tablet by mouth every 4 (four) hours as needed.           Marland Kitchen FOLIC ACID PO   Oral   Take by mouth.           . meloxicam (MOBIC) 15 MG  tablet   Oral   Take 1 tablet (15 mg total) by mouth daily.   30 tablet   1   . Prenatal Vitamins (DIS) TABS   Oral   Take by mouth.           . traMADol (ULTRAM) 50 MG tablet   Oral   Take 50 mg by mouth every 6 (six) hours as needed for pain.         . valACYclovir (VALTREX) 1000 MG tablet      Take twice a day for 3 days when symptoms appear.   6 tablet   3    BP 111/80  Pulse 72  Resp 17  SpO2 98%  LMP 05/18/2013 Physical Exam  Nursing note and vitals reviewed. Constitutional: She appears well-developed and well-nourished.  HENT:  Head: Normocephalic.  Right Ear: External ear normal.  Left Ear: External ear normal.  Nose: Nose normal.  Mouth/Throat: Oropharynx is clear and moist.  Eyes: Conjunctivae are normal. Pupils are equal, round, and reactive to light.  Neck: Normal range of motion. Neck supple.  Cardiovascular: Normal rate.   Pulmonary/Chest: Effort normal.  Abdominal: Soft.  Musculoskeletal: Normal range of motion.  Neurological: She is alert.  Skin: Skin is warm.  Psychiatric: She has a normal mood and affect.    ED Course  Procedures (including critical care time) Labs Review Labs Reviewed - No data to display Imaging Review No results found.  EKG Interpretation   None       MDM   1. Headache       Elson Areas, PA-C 06/07/13 [phone]

## 2013-06-07 NOTE — ED Provider Notes (Signed)
Medical screening examination/treatment/procedure(s) were performed by non-physician practitioner and as supervising physician I was immediately available for consultation/collaboration.  EKG Interpretation     Ventricular Rate:  63 PR Interval:    QRS Duration: 93 QT Interval:  418 QTC Calculation: 428 R Axis:   69 Text Interpretation:  Age not entered, assumed to be  42 years old for purpose of ECG interpretation Junctional rhythm              Jarmal Lewelling B. Bernette Mayers, MD 06/07/13 1415

## 2013-06-08 ENCOUNTER — Ambulatory Visit: Payer: Self-pay | Admitting: Internal Medicine

## 2013-06-08 VITALS — BP 108/66 | HR 67 | Temp 98.6°F | Resp 18 | Ht 68.5 in | Wt 208.6 lb

## 2013-06-08 DIAGNOSIS — IMO0002 Reserved for concepts with insufficient information to code with codable children: Secondary | ICD-10-CM

## 2013-06-08 DIAGNOSIS — N920 Excessive and frequent menstruation with regular cycle: Secondary | ICD-10-CM

## 2013-06-08 DIAGNOSIS — R519 Headache, unspecified: Secondary | ICD-10-CM

## 2013-06-08 DIAGNOSIS — R110 Nausea: Secondary | ICD-10-CM

## 2013-06-08 DIAGNOSIS — R51 Headache: Secondary | ICD-10-CM

## 2013-06-08 DIAGNOSIS — M5416 Radiculopathy, lumbar region: Secondary | ICD-10-CM

## 2013-06-08 DIAGNOSIS — Z98891 History of uterine scar from previous surgery: Secondary | ICD-10-CM

## 2013-06-08 DIAGNOSIS — M47816 Spondylosis without myelopathy or radiculopathy, lumbar region: Secondary | ICD-10-CM

## 2013-06-08 MED ORDER — BUTALBITAL-APAP-CAFFEINE 50-325-40 MG PO TABS: 1.0000 | ORAL_TABLET | Freq: Two times a day (BID) | ORAL | Status: DC | PRN

## 2013-06-08 MED ORDER — TRAMADOL HCL 50 MG PO TABS: 50.0000 mg | ORAL_TABLET | Freq: Four times a day (QID) | ORAL | Status: DC

## 2013-06-08 MED ORDER — ONDANSETRON 4 MG PO TBDP: 4.0000 mg | ORAL_TABLET | Freq: Three times a day (TID) | ORAL | Status: DC | PRN
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
## 2013-06-09 ENCOUNTER — Encounter: Payer: Self-pay | Admitting: Internal Medicine

## 2013-06-09 DIAGNOSIS — M47816 Spondylosis without myelopathy or radiculopathy, lumbar region: Secondary | ICD-10-CM | POA: Insufficient documentation

## 2013-06-09 DIAGNOSIS — N920 Excessive and frequent menstruation with regular cycle: Secondary | ICD-10-CM | POA: Insufficient documentation

## 2013-06-09 DIAGNOSIS — Z98891 History of uterine scar from previous surgery: Secondary | ICD-10-CM | POA: Insufficient documentation

## 2013-08-03 ENCOUNTER — Other Ambulatory Visit: Payer: Self-pay | Admitting: Internal Medicine

## 2013-08-04 NOTE — Telephone Encounter (Signed)
Called pharm to check when RFs were p/up. First fill 11/17, then 12/5 and 12/26.

## 2013-08-04 NOTE — Telephone Encounter (Signed)
Pharmacy just filled 12/26, is that al out - when does pt have an appt for back at [facility]?

## 2013-08-11 NOTE — Telephone Encounter (Signed)
Patient hasn't heard re: prescription requested refill 08/03/13.  Pharmacy told her to call us. Refill request found with request for more information 08/04/2013.  Chart reviewed. Follow-up planned at 06/08/2013 visit is "as needed." Per the patient, she's to follow-up in 6 months because of the medications she's on, and her lack of health insurance.  Patient is taking 2 tablets every 6 hours, so 120 tabs is a 14-day supply. As such, the prescription received 11/17 with 2 refills (filled 12/05 and 12/26) is out.  She was to be referred to [facility] for additional evaluation, but hasn't received a call regarding an appointment.  Additionally, she needs to make a follow-up appointment with Dr. Laney Pastor, but has "been on hold for 30-minutes getting transferred from person to person." She's extremely frustrated.  14-day supply provided.  I will forward this message to the scheduling pool to get the appointment scheduled with Dr. Laney Pastor, the referrals staff for the referral to [facility] for back pain (lumbar radicular syndrome with lumbar spondylosis-radicular symptoms), and to Dr. Laney Pastor as Juluis Rainier.  Meds ordered this encounter  Medications  . traMADol (ULTRAM) 50 MG tablet    Sig: TAKE 1-2 TABLETS BY MOUTH 4 TIMES A DAY    Dispense:  120 tablet    Refill:  0

## 2013-08-11 NOTE — Telephone Encounter (Signed)
Patient has scheduled appointment with Dr Laney Pastor on 08/19/13 @ 1:15 pm.

## 2013-08-19 ENCOUNTER — Ambulatory Visit: Payer: Self-pay | Admitting: Internal Medicine

## 2013-08-28 ENCOUNTER — Other Ambulatory Visit: Payer: Self-pay | Admitting: Physician Assistant

## 2013-08-31 ENCOUNTER — Other Ambulatory Visit: Payer: Self-pay | Admitting: Physician Assistant

## 2013-08-31 NOTE — Telephone Encounter (Signed)
Ok but---we ref to [id number] in nov for this ---what happened

## 2013-09-01 NOTE — Telephone Encounter (Signed)
Referrals stated that they never could get a CB from Dr Nelly Laurence office. I called and was able to speak to Dr Nelly Laurence office and was advised he does not see pts w/out ins. They do have a clinic there that DOES see pts w/out ins and all records inc any MRI/imaging results, demographics, office notes need to be faxed to the same fax # [phone] Connie Bryan (who handles the clinic pts). Her phone # is [phone]. Spoke w/Dr Laney Pastor and pt and both are agreeable to get her in to either the clinic or anyone in [place] that will see her as soon as possible. Referrals, please try to schedule.

## 2013-09-24 ENCOUNTER — Other Ambulatory Visit: Payer: Self-pay | Admitting: Internal Medicine

## 2013-09-24 NOTE — Telephone Encounter (Signed)
Also left message that I asked Referrals to check into her referral to try to get her in to see someone who will see her w/out ins.

## 2013-09-24 NOTE — Telephone Encounter (Signed)
Dr Cindra Presume, I called [facility] to check status of referral. She stated that she did not get the message in Refill encounter from 08/28/13 that I forwarded to her, but will use the info provided there to try to get pt appt.

## 2013-09-24 NOTE — Telephone Encounter (Signed)
Called in and notified pt

## 2013-09-28 NOTE — Telephone Encounter (Signed)
Shredded printed Rx since I had already called in Rx.

## 2013-10-02 ENCOUNTER — Encounter (HOSPITAL_COMMUNITY): Payer: Self-pay | Admitting: General Practice

## 2013-10-02 ENCOUNTER — Inpatient Hospital Stay (HOSPITAL_COMMUNITY)
Admission: AD | Admit: 2013-10-02 | Discharge: 2013-10-02 | Disposition: A | Discharge status: Home / Self Care | Payer: Self-pay | Source: Ambulatory Visit | Attending: Family Medicine | Admitting: Family Medicine

## 2013-10-02 ENCOUNTER — Inpatient Hospital Stay (HOSPITAL_COMMUNITY): Payer: Medicaid Other

## 2013-10-02 DIAGNOSIS — N925 Other specified irregular menstruation: Secondary | ICD-10-CM

## 2013-10-02 DIAGNOSIS — R102 Pelvic and perineal pain: Secondary | ICD-10-CM

## 2013-10-02 DIAGNOSIS — N946 Dysmenorrhea, unspecified: Secondary | ICD-10-CM

## 2013-10-02 DIAGNOSIS — N949 Unspecified condition associated with female genital organs and menstrual cycle: Secondary | ICD-10-CM

## 2013-10-02 DIAGNOSIS — R109 Unspecified abdominal pain: Secondary | ICD-10-CM

## 2013-10-02 DIAGNOSIS — F17200 Nicotine dependence, unspecified, uncomplicated: Secondary | ICD-10-CM

## 2013-10-02 DIAGNOSIS — N938 Other specified abnormal uterine and vaginal bleeding: Secondary | ICD-10-CM

## 2013-10-02 LAB — CBC
HCT: 37.9 % (ref 36.0–46.0)
HEMOGLOBIN: 12.9 g/dL (ref 12.0–15.0)
MCH: 31.4 pg (ref 26.0–34.0)
MCHC: 34 g/dL (ref 30.0–36.0)
MCV: 92.2 fL (ref 78.0–100.0)
Platelets: 302 10*3/uL (ref 150–400)
RBC: 4.11 MIL/uL (ref 3.87–5.11)
RDW: 13.1 % (ref 11.5–15.5)
WBC: 8.3 10*3/uL (ref 4.0–10.5)

## 2013-10-02 LAB — URINALYSIS, ROUTINE W REFLEX MICROSCOPIC
Bilirubin Urine: NEGATIVE
GLUCOSE, UA: NEGATIVE mg/dL
Ketones, ur: 15 mg/dL — AB
Nitrite: NEGATIVE
PH: 6.5 (ref 5.0–8.0)
PROTEIN: 100 mg/dL — AB
SPECIFIC GRAVITY, URINE: 1.02 (ref 1.005–1.030)
Urobilinogen, UA: 0.2 mg/dL (ref 0.0–1.0)

## 2013-10-02 LAB — URINE MICROSCOPIC-ADD ON

## 2013-10-02 LAB — WET PREP, GENITAL
Clue Cells Wet Prep HPF POC: NONE SEEN
TRICH WET PREP: NONE SEEN
Yeast Wet Prep HPF POC: NONE SEEN

## 2013-10-02 LAB — POCT PREGNANCY, URINE: PREG TEST UR: NEGATIVE

## 2013-10-02 MED ORDER — ACETAMINOPHEN 500 MG PO TABS
1000.0000 mg | ORAL_TABLET | Freq: Once | ORAL | Status: AC
  Administered 2013-10-02: 1000 mg via ORAL
  Filled 2013-10-02: qty 2

## 2013-10-02 MED ORDER — OXYCODONE-ACETAMINOPHEN 5-325 MG PO TABS
1.0000 | ORAL_TABLET | ORAL | Status: DC | PRN
Start: 2013-10-02 — End: 2013-11-21

## 2013-10-02 NOTE — Discharge Instructions (Signed)

## 2013-10-02 NOTE — MAU Note (Addendum)
LMP 2/5. Had abd cramping this am and went to [facility] and had a lot of vag bleeding. Went on thru my day and cramping and bleeding has gotten worse. Cramping is in lower stomach and lower back.

## 2013-10-02 NOTE — MAU Provider Note (Signed)
History     CSN: [id number]  Arrival date and time: 10/02/13 7829   First Provider Initiated Contact with Patient 10/02/13 2044      Chief Complaint  Patient presents with  . Abdominal Pain  . Vaginal Bleeding   Abdominal Pain  Vaginal Bleeding Associated symptoms include abdominal pain.    Connie Bryan is a 43 y.o. [phone] who presents today with bleeding and pain. She states that she woke up this morning with vaginal bleeding that ws as heavy as a period. Along with the pain she has had 10/10 lower abdominal pain. She states that the pain is all over in her lower abdomen and is not on one side more than the other. She took ibuprofen and tramadol, but it has not helped.   History reviewed. No pertinent past medical history.  Past Surgical History  Procedure Laterality Date  . Cesarean section    . Cholecystectomy      No family history on file.  History  Substance Use Topics  . Smoking status: Current Every Day Smoker    Types: Cigarettes  . Smokeless tobacco: Never Used  . Alcohol Use: Yes     Comment: occasional    Allergies: No Known Allergies  Prescriptions prior to admission  Medication Sig Dispense Refill  . ibuprofen (ADVIL,MOTRIN) 200 MG tablet Take 400 mg by mouth every 6 (six) hours as needed for moderate pain.      . traMADol (ULTRAM) 50 MG tablet TAKE ONE OR TWO TABLETS 4 TIMES DAILY  120 tablet  0    Review of Systems  Gastrointestinal: Positive for abdominal pain.  Genitourinary: Positive for vaginal bleeding.   Physical Exam   Blood pressure 124/69, pulse 66, temperature 98.6 F (37 C), temperature source Oral, resp. rate 20, height 5' 7.5" (1.715 m), weight 94.53 kg (208 lb 6.4 oz), SpO2 100.00%.  Physical Exam  Nursing note and vitals reviewed. Constitutional: She is oriented to person, place, and time. She appears well-developed and well-nourished. No distress (appears uncomfortable, but not distressed. ).  Cardiovascular: Normal rate.    Respiratory: Effort normal.  GI: Soft. She exhibits no mass. There is no tenderness. There is no rebound and no guarding.  Genitourinary:   External: no lesion Vagina: small amount of blood seen  Cervix: pink, smooth, some CMT  Uterus: NSSC Adnexa: NT   Neurological: She is alert and oriented to person, place, and time.  Skin: Skin is warm and dry.  Psychiatric: She has a normal mood and affect.    MAU Course  Procedures  Results for orders placed during the hospital encounter of 10/02/13 (from the past 24 hour(s))  URINALYSIS, ROUTINE W REFLEX MICROSCOPIC     Status: Abnormal   Collection Time    10/02/13  8:08 PM      Result Value Ref Range   Color, Urine YELLOW  YELLOW   APPearance TURBID (*) CLEAR   Specific Gravity, Urine 1.020  1.005 - 1.030   pH 6.5  5.0 - 8.0   Glucose, UA NEGATIVE  NEGATIVE mg/dL   Hgb urine dipstick LARGE (*) NEGATIVE   Bilirubin Urine NEGATIVE  NEGATIVE   Ketones, ur 15 (*) NEGATIVE mg/dL   Protein, ur 100 (*) NEGATIVE mg/dL   Urobilinogen, UA 0.2  0.0 - 1.0 mg/dL   Nitrite NEGATIVE  NEGATIVE   Leukocytes, UA TRACE (*) NEGATIVE  URINE MICROSCOPIC-ADD ON     Status: Abnormal   Collection Time    10/02/13  8:08 PM      Result Value Ref Range   Squamous Epithelial / LPF FEW (*) RARE   WBC, UA 0-2  <3 WBC/hpf   RBC / HPF TOO NUMEROUS TO COUNT  <3 RBC/hpf   Bacteria, UA RARE  RARE  POCT PREGNANCY, URINE     Status: None   Collection Time    10/02/13  8:18 PM      Result Value Ref Range   Preg Test, Ur NEGATIVE  NEGATIVE  WET PREP, GENITAL     Status: Abnormal   Collection Time    10/02/13  8:45 PM      Result Value Ref Range   Yeast Wet Prep HPF POC NONE SEEN  NONE SEEN   Trich, Wet Prep NONE SEEN  NONE SEEN   Clue Cells Wet Prep HPF POC NONE SEEN  NONE SEEN   WBC, Wet Prep HPF POC FEW (*) NONE SEEN  CBC     Status: None   Collection Time    10/02/13  9:35 PM      Result Value Ref Range   WBC 8.3  4.0 - 10.5 K/uL   RBC 4.11  3.87 -  5.11 MIL/uL   Hemoglobin 12.9  12.0 - 15.0 g/dL   HCT 37.9  36.0 - 46.0 %   MCV 92.2  78.0 - 100.0 fL   MCH 31.4  26.0 - 34.0 pg   MCHC 34.0  30.0 - 36.0 g/dL   RDW 13.1  11.5 - 15.5 %   Platelets 302  150 - 400 K/uL   US Transvaginal Non-ob  10/02/2013   CLINICAL DATA:  Pelvic pain, cramping and vaginal bleeding. Last menstrual period today. Previous C-section.  EXAM: TRANSABDOMINAL AND TRANSVAGINAL ULTRASOUND OF PELVIS  TECHNIQUE: Both transabdominal and transvaginal ultrasound examinations of the pelvis were performed. Transabdominal technique was performed for global imaging of the pelvis including uterus, ovaries, adnexal regions, and pelvic cul-de-sac. It was necessary to proceed with endovaginal exam following the transabdominal exam to visualize the uterus and ovaries in better detail.  COMPARISON:  Previous obstetrical ultrasounds and pelvis CT dated 07/04/2010.  FINDINGS: Uterus  Measurements: 7.3 x 5.9 x 4.7 cm.  C-section scar.  No visible mass.  Endometrium  Thickness: 10.3 mm.  No focal abnormality visualized.  Right ovary  Measurements: 3.1 x 1.7 x 1.4 cm. Normal appearance/no adnexal mass.  Left ovary  Measurements: 2.5 x 1.6 x 1.6 cm. Normal appearance/no adnexal mass.  Other findings  No free fluid.  IMPRESSION: No acute abnormality.   Electronically Signed   By: Enrique Sack M.D.   On: 10/02/2013 21:39   US Pelvis Complete  10/02/2013   CLINICAL DATA:  Pelvic pain, cramping and vaginal bleeding. Last menstrual period today. Previous C-section.  EXAM: TRANSABDOMINAL AND TRANSVAGINAL ULTRASOUND OF PELVIS  TECHNIQUE: Both transabdominal and transvaginal ultrasound examinations of the pelvis were performed. Transabdominal technique was performed for global imaging of the pelvis including uterus, ovaries, adnexal regions, and pelvic cul-de-sac. It was necessary to proceed with endovaginal exam following the transabdominal exam to visualize the uterus and ovaries in better detail.   COMPARISON:  Previous obstetrical ultrasounds and pelvis CT dated 07/04/2010.  FINDINGS: Uterus  Measurements: 7.3 x 5.9 x 4.7 cm.  C-section scar.  No visible mass.  Endometrium  Thickness: 10.3 mm.  No focal abnormality visualized.  Right ovary  Measurements: 3.1 x 1.7 x 1.4 cm. Normal appearance/no adnexal mass.  Left ovary  Measurements: 2.5 x  1.6 x 1.6 cm. Normal appearance/no adnexal mass.  Other findings  No free fluid.  IMPRESSION: No acute abnormality.   Electronically Signed   By: Enrique Sack M.D.   On: 10/02/2013 21:39     Assessment and Plan  Dysmenorrhea Comfort measures reviewed Peri-menopause discussed Return to MAU as needed   Follow-up Information   Follow up with [facility] [place]. (As needed)    Contact information:   [place] [id number] [phone]       Mathis Bud 10/02/2013, 10:01 PM

## 2013-10-02 NOTE — MAU Provider Note (Signed)
Attestation of Attending Supervision of Advanced Practitioner (PA/CNM/NP): Evaluation and management procedures were performed by the Advanced Practitioner under my supervision and collaboration.  I have reviewed the Advanced Practitioner's note and chart, and I agree with the management and plan.  Donnamae Jude, MD [facility] Attending 10/02/2013 11:37 PM

## 2013-10-03 LAB — GC/CHLAMYDIA PROBE AMP
CT PROBE, AMP APTIMA: NEGATIVE
GC PROBE AMP APTIMA: NEGATIVE

## 2013-10-04 LAB — URINE CULTURE: Colony Count: 60000

## 2013-10-12 ENCOUNTER — Other Ambulatory Visit: Payer: Self-pay | Admitting: Internal Medicine

## 2013-10-12 NOTE — Telephone Encounter (Signed)
LM on Referrals VM to call me w/status of referral.

## 2013-10-13 NOTE — Telephone Encounter (Signed)
Dr Laney Pastor, I haven't heard back from Referrals as to the status of this referral, and no answer when I tried to call them just now. I will forward this to Butch Penny to have her check on it again. Please advise on RF.

## 2013-10-14 ENCOUNTER — Telehealth: Payer: Self-pay | Admitting: Family Medicine

## 2013-10-14 NOTE — Telephone Encounter (Signed)
Rx printed and Edd Fabian faxed in from [facility]. I called pt and LMOM advising has been faxed.

## 2013-10-14 NOTE — Telephone Encounter (Signed)
Pt Connie Bryan about Tramadol. She is completely w/out the Tramadol now and is in a lot of pain. I have still not heard anything back from Referrals and there is no one doing Referrals this afternoon.

## 2013-10-14 NOTE — Telephone Encounter (Signed)
Faxed prescription for Tramadol to [facility] [address].

## 2013-10-14 NOTE — Telephone Encounter (Signed)
Butch Penny, please check again on this referral. See notes under 2/6 and 3/5 Refill encounters ( go to chart review - encounters- unclick default filter at top and look for Refill encounters)

## 2013-11-02 ENCOUNTER — Other Ambulatory Visit: Payer: Self-pay

## 2013-11-02 ENCOUNTER — Other Ambulatory Visit: Payer: Self-pay | Admitting: Internal Medicine

## 2013-11-02 MED ORDER — TRAMADOL HCL 50 MG PO TABS: ORAL_TABLET | ORAL | Status: DC

## 2013-11-02 NOTE — Telephone Encounter (Signed)
Pt LM on my VM asking about RF for tramadol and to once again check status of referral to neurosurgery. I called Butch Penny and reminded her that I had spoken w/her when I forwarded last message about referral to her on 10/12/13 RF encounter note. Also explained that there are notes on RF encounters from 08/28/13, 09/24/13 concerning who to send referral to from info I found when I called Dr Nelly Laurence office previously. Butch Penny assured me she would take care of this.   Dr Laney Pastor, please address RF req. Pended.

## 2013-11-03 ENCOUNTER — Telehealth: Payer: Self-pay

## 2013-11-03 ENCOUNTER — Telehealth: Payer: Self-pay | Admitting: *Deleted

## 2013-11-03 NOTE — Telephone Encounter (Signed)
rx for tramadol called in 

## 2013-11-03 NOTE — Telephone Encounter (Signed)
[place] faxed in Rx and pt aware.

## 2013-11-18 ENCOUNTER — Other Ambulatory Visit: Payer: Self-pay | Admitting: Internal Medicine

## 2013-11-19 MED ORDER — TRAMADOL HCL 50 MG PO TABS: ORAL_TABLET | ORAL | Status: DC

## 2013-11-19 NOTE — Telephone Encounter (Signed)
Called in Rx for #24 and notified pt.

## 2013-11-19 NOTE — Addendum Note (Signed)
Addended by: Elwyn Reach A on: 11/19/2013 09:34 AM   Modules accepted: Orders

## 2013-11-19 NOTE — Telephone Encounter (Addendum)
Pt called and wanted to RTC today to see Dr Laney Pastor, but he will not be back in office until Sat. Pt states that she will be here early Sat to see him, but requests that we please send in enough tramadol for her through Sat. She has needed RF Q 2 weeks, so I will pend enough through Sat at max 2 tabs Q 6 hrs. Dr Laney Pastor, Referrals resent referral info to correct department at [facility] on 11/03/13, and then f/up w/phone call to contact, Vaughan Basta, on 11/12/13 who reported that they are still reviewing pt's records and will mail appt to pt. Pt reported this AM that she has not received appt date yet. I called [facility] myself this morn and spoke to April Vaughan Basta out of office this week) who is going to check status and CB.

## 2013-11-21 ENCOUNTER — Ambulatory Visit: Payer: Self-pay | Admitting: Internal Medicine

## 2013-11-21 VITALS — BP 110/64 | HR 67 | Temp 99.1°F | Resp 16 | Ht 68.9 in | Wt 211.0 lb

## 2013-11-21 DIAGNOSIS — M549 Dorsalgia, unspecified: Secondary | ICD-10-CM

## 2013-11-21 DIAGNOSIS — M47816 Spondylosis without myelopathy or radiculopathy, lumbar region: Secondary | ICD-10-CM

## 2013-11-21 MED ORDER — MELOXICAM 15 MG PO TABS: 15.0000 mg | ORAL_TABLET | Freq: Every day | ORAL | Status: DC

## 2013-11-21 MED ORDER — TRAMADOL HCL 50 MG PO TABS: ORAL_TABLET | ORAL | Status: DC

## 2013-11-21 MED ORDER — CYCLOBENZAPRINE HCL 10 MG PO TABS: 10.0000 mg | ORAL_TABLET | Freq: Every day | ORAL | Status: DC

## 2013-11-21 NOTE — Patient Instructions (Addendum)
Connie Bryan,  [place], [place]
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
## 2014-01-02 ENCOUNTER — Ambulatory Visit (INDEPENDENT_AMBULATORY_CARE_PROVIDER_SITE_OTHER): Payer: Self-pay | Admitting: Internal Medicine

## 2014-01-02 VITALS — BP 102/68 | HR 107 | Temp 99.4°F | Resp 20 | Ht 68.5 in | Wt 213.6 lb

## 2014-01-02 DIAGNOSIS — Z3480 Encounter for supervision of other normal pregnancy, unspecified trimester: Secondary | ICD-10-CM

## 2014-01-02 DIAGNOSIS — R112 Nausea with vomiting, unspecified: Secondary | ICD-10-CM

## 2014-01-02 DIAGNOSIS — Z98891 History of uterine scar from previous surgery: Secondary | ICD-10-CM

## 2014-01-02 DIAGNOSIS — M47817 Spondylosis without myelopathy or radiculopathy, lumbosacral region: Secondary | ICD-10-CM

## 2014-01-02 DIAGNOSIS — M47816 Spondylosis without myelopathy or radiculopathy, lumbar region: Secondary | ICD-10-CM

## 2014-01-02 DIAGNOSIS — Z9889 Other specified postprocedural states: Secondary | ICD-10-CM

## 2014-01-02 LAB — POCT URINE PREGNANCY: Preg Test, Ur: POSITIVE

## 2014-01-02 MED ORDER — ONDANSETRON HCL 4 MG PO TABS: 4.0000 mg | ORAL_TABLET | Freq: Three times a day (TID) | ORAL | Status: DC | PRN

## 2014-01-02 MED ORDER — INDOMETHACIN ER 75 MG PO CPCR: 75.0000 mg | ORAL_CAPSULE | Freq: Two times a day (BID) | ORAL | Status: DC

## 2014-01-02 NOTE — Progress Notes (Signed)
Subjective:    Patient ID: Connie Bryan, female    DOB: [birth year], 43 y.o.   MRN: [id number]  This chart was scribed for Connie Lin, MD by Erling Conte, Medical Scribe. This patient was seen in Room 4 and the patient's care was started at 12:46 PM.  Chief Complaint  Patient presents with  . Amenorrhea      HPI HPI Comments: Connie Bryan is a 43 y.o. female who presents to the [facility] complaining of amenorrhea. She is having associated nausea and emesis. Her LNMP was 12/03/13. She states that she took a pregnancy test at home today and that it was positive. She states that she wants to be evaluated for pregnancy. She denies any abdominal or pelvic pain.  Has had 3 prior cesarean sections all in [place]/no complications  Patient Active Problem List   Diagnosis Date Noted  . Lumbar spondylosis 06/09/2013  . Menorrhagia 06/09/2013  . H/O cesarean section X 3 06/09/2013   She has been on tramadol for chronic pain recently-- history is detailed in chart Intervention back specialist is pending  Review of Systems  Gastrointestinal: Positive for nausea and vomiting. Negative for abdominal pain.  Genitourinary: Positive for menstrual problem (amenorrhea). Negative for pelvic pain.  All other systems reviewed and are negative.      Objective:   Physical Exam  Nursing note and vitals reviewed. Constitutional: She is oriented to person, place, and time. She appears well-developed and well-nourished. No distress.  HENT:  Head: Normocephalic and atraumatic.  Eyes: Conjunctivae and EOM are normal.  Neck: Normal range of motion.  Cardiovascular: Normal rate, regular rhythm and normal heart sounds.   Pulmonary/Chest: Effort normal and breath sounds normal. No respiratory distress.  Musculoskeletal: Normal range of motion.  Neurological: She is alert and oriented to person, place, and time.  Skin: Skin is warm and dry.  Psychiatric: She has a normal mood and  affect. Her behavior is normal.    Results for orders placed in visit on 01/02/14  POCT URINE PREGNANCY      Result Value Ref Range   Preg Test, Ur Positive     MRI October 2014 demonstrated disc space narrowing and disc desiccation at L2-3 and L4-5 with an annular tear of unknown age at L4-5 and a cyst at L2-3///she was evaluated by Dr. Lynann Bologna and told to her no surgical options at this point  I sent her to [facility] for second opinion but she has not been seen yet    Assessment & Plan:  I have completed the patient encounter in its entirety as documented by the scribe, with editing by me where necessary. Micala Saltsman P. Laney Pastor, M.D.  Nausea with vomiting - Plan: POCT urine pregnancy  Supervision of other normal pregnancy - Plan: Ambulatory referral to Obstetrics / Gynecology  Lumbar spondylosis-with chronic pain  H/O cesarean section X 3  She is very worried because she has been requiring tramadol to be a low to be pain-free enough to continue working while awaiting orthopedic consultation. She understands that she must not take this medicine during pregnancy and is very concerned about what to do. I'll refer her to obstetrician Dr Benjie Karvonen for pregnancy care and further consideration of her dilemma. For now she may use Zofran for nausea associated with pregnancy and associated with discontinuation of tramadol. She may use Indocin 75 mg twice a day as an alternative pain medication. She may use Tylenol 2 tablets up to 4 times a  day.

## 2014-01-16 ENCOUNTER — Inpatient Hospital Stay (HOSPITAL_COMMUNITY)
Admission: AD | Admit: 2014-01-16 | Discharge: 2014-01-16 | Disposition: A | Discharge status: Home / Self Care | Payer: Medicaid Other | Source: Ambulatory Visit | Attending: Obstetrics & Gynecology | Admitting: Obstetrics & Gynecology

## 2014-01-16 ENCOUNTER — Encounter (HOSPITAL_COMMUNITY): Payer: Self-pay

## 2014-01-16 ENCOUNTER — Other Ambulatory Visit: Payer: Self-pay

## 2014-01-16 ENCOUNTER — Inpatient Hospital Stay (HOSPITAL_COMMUNITY): Payer: Medicaid Other

## 2014-01-16 DIAGNOSIS — O26899 Other specified pregnancy related conditions, unspecified trimester: Secondary | ICD-10-CM

## 2014-01-16 DIAGNOSIS — M549 Dorsalgia, unspecified: Secondary | ICD-10-CM | POA: Insufficient documentation

## 2014-01-16 DIAGNOSIS — O99891 Other specified diseases and conditions complicating pregnancy: Secondary | ICD-10-CM | POA: Insufficient documentation

## 2014-01-16 DIAGNOSIS — O9989 Other specified diseases and conditions complicating pregnancy, childbirth and the puerperium: Principal | ICD-10-CM

## 2014-01-16 DIAGNOSIS — R109 Unspecified abdominal pain: Secondary | ICD-10-CM

## 2014-01-16 DIAGNOSIS — O99330 Smoking (tobacco) complicating pregnancy, unspecified trimester: Secondary | ICD-10-CM | POA: Insufficient documentation

## 2014-01-16 DIAGNOSIS — G8929 Other chronic pain: Secondary | ICD-10-CM | POA: Insufficient documentation

## 2014-01-16 LAB — CBC
HEMATOCRIT: 38.8 % (ref 36.0–46.0)
HEMOGLOBIN: 13.1 g/dL (ref 12.0–15.0)
MCH: 31.4 pg (ref 26.0–34.0)
MCHC: 33.8 g/dL (ref 30.0–36.0)
MCV: 93 fL (ref 78.0–100.0)
Platelets: 301 10*3/uL (ref 150–400)
RBC: 4.17 MIL/uL (ref 3.87–5.11)
RDW: 13.5 % (ref 11.5–15.5)
WBC: 7.4 10*3/uL (ref 4.0–10.5)

## 2014-01-16 LAB — URINALYSIS, ROUTINE W REFLEX MICROSCOPIC
Bilirubin Urine: NEGATIVE
Glucose, UA: NEGATIVE mg/dL
Ketones, ur: NEGATIVE mg/dL
LEUKOCYTES UA: NEGATIVE
Nitrite: NEGATIVE
PROTEIN: NEGATIVE mg/dL
Specific Gravity, Urine: 1.015 (ref 1.005–1.030)
Urobilinogen, UA: 0.2 mg/dL (ref 0.0–1.0)
pH: 7 (ref 5.0–8.0)

## 2014-01-16 LAB — WET PREP, GENITAL
Clue Cells Wet Prep HPF POC: NONE SEEN
Trich, Wet Prep: NONE SEEN
Yeast Wet Prep HPF POC: NONE SEEN

## 2014-01-16 LAB — HCG, QUANTITATIVE, PREGNANCY: hCG, Beta Chain, Quant, S: 3980 m[IU]/mL — ABNORMAL HIGH (ref <5)

## 2014-01-16 LAB — URINE MICROSCOPIC-ADD ON

## 2014-01-16 LAB — POCT PREGNANCY, URINE: PREG TEST UR: POSITIVE — AB

## 2014-01-16 LAB — HIV ANTIBODY (ROUTINE TESTING W REFLEX): HIV 1&2 Ab, 4th Generation: NONREACTIVE

## 2014-01-16 MED ORDER — ACETAMINOPHEN 325 MG PO TABS: 650.0000 mg | ORAL_TABLET | Freq: Once | ORAL | Status: DC

## 2014-01-16 NOTE — Discharge Instructions (Signed)

## 2014-01-16 NOTE — MAU Note (Signed)
Pt presents to MAU with complaints of lower abdominal pain that radiates to both sides of her abdomen. States the pain started 4 days ago.

## 2014-01-16 NOTE — MAU Provider Note (Signed)
History     CSN: [id number]  Arrival date and time: 01/16/14 1315   First [place]a Shoaf Initiated Contact with Patient 01/16/14 1342      Chief Complaint  Patient presents with  . Abdominal Pain   HPI Comments: Connie Bryan  43 y.o. [id number] [redacted]w[redacted]d presents to MAU with right ovarian pain that has been ongoing x 4 days. She has a history of chronic back pain and has been taking NSAIDs, Ultram and occasional  Valium. She denies any bleeding.    Abdominal Pain      History reviewed. No pertinent past medical history.  Past Surgical History  Procedure Laterality Date  . Cesarean section    . Cholecystectomy      History reviewed. No pertinent family history.  History  Substance Use Topics  . Smoking status: Current Every Day Smoker    Types: Cigarettes  . Smokeless tobacco: Never Used  . Alcohol Use: Yes     Comment: occasional    Allergies: No Known Allergies  Prescriptions prior to admission  Medication Sig Dispense Refill  . ibuprofen (ADVIL,MOTRIN) 200 MG tablet Take 400 mg by mouth every 6 (six) hours as needed for moderate pain.      Marland Kitchen ondansetron (ZOFRAN) 4 MG tablet Take 1 tablet (4 mg total) by mouth every 8 (eight) hours as needed for nausea or vomiting.  42 tablet  2  . traMADol (ULTRAM) 50 MG tablet TAKE ONE OR TWO TABLETS 4 TIMES DAILY  240 tablet  5    Review of Systems  Constitutional: Negative.   HENT: Negative.   Eyes: Negative.   Respiratory: Negative.   Cardiovascular: Negative.   Gastrointestinal: Positive for abdominal pain.  Genitourinary: Negative.   Musculoskeletal: Positive for back pain.  Skin: Negative.   Neurological: Negative.   Psychiatric/Behavioral: Negative.    Physical Exam   Blood pressure 109/63, pulse 73, temperature 97.9 F (36.6 C), resp. rate 18, last menstrual period 12/03/2013.  Physical Exam  Constitutional: She appears well-developed and well-nourished. No distress.  HENT:  Head: Normocephalic and atraumatic.  Eyes:  Conjunctivae are normal. Pupils are equal, round, and reactive to light.  Neck: Normal range of motion.  GI: Soft. Bowel sounds are normal. She exhibits no distension and no mass. There is tenderness. There is no rebound and no guarding.  Right ovary tenderness  Genitourinary:  Genital:External negative Vaginal:small amount clear discharge Cervix:closed/ thick Bimanual:tenederness right ovary    Results for orders placed during the hospital encounter of 01/16/14 (from the past 24 hour(s))  URINALYSIS, ROUTINE W REFLEX MICROSCOPIC     Status: Abnormal   Collection Time    01/16/14  1:20 PM      Result Value Ref Range   Color, Urine YELLOW  YELLOW   APPearance CLEAR  CLEAR   Specific Gravity, Urine 1.015  1.005 - 1.030   pH 7.0  5.0 - 8.0   Glucose, UA NEGATIVE  NEGATIVE mg/dL   Hgb urine dipstick MODERATE (*) NEGATIVE   Bilirubin Urine NEGATIVE  NEGATIVE   Ketones, ur NEGATIVE  NEGATIVE mg/dL   Protein, ur NEGATIVE  NEGATIVE mg/dL   Urobilinogen, UA 0.2  0.0 - 1.0 mg/dL   Nitrite NEGATIVE  NEGATIVE   Leukocytes, UA NEGATIVE  NEGATIVE  URINE MICROSCOPIC-ADD ON     Status: Abnormal   Collection Time    01/16/14  1:20 PM      Result Value Ref Range   Squamous Epithelial / LPF FEW (*) RARE  RBC / HPF 11-20  <3 RBC/hpf   Urine-Other MUCOUS PRESENT    POCT PREGNANCY, URINE     Status: Abnormal   Collection Time    01/16/14  1:34 PM      Result Value Ref Range   Preg Test, Ur POSITIVE (*) NEGATIVE  WET PREP, GENITAL     Status: Abnormal   Collection Time    01/16/14  1:50 PM      Result Value Ref Range   Yeast Wet Prep HPF POC NONE SEEN  NONE SEEN   Trich, Wet Prep NONE SEEN  NONE SEEN   Clue Cells Wet Prep HPF POC NONE SEEN  NONE SEEN   WBC, Wet Prep HPF POC FEW (*) NONE SEEN  CBC     Status: None   Collection Time    01/16/14  2:13 PM      Result Value Ref Range   WBC 7.4  4.0 - 10.5 K/uL   RBC 4.17  3.87 - 5.11 MIL/uL   Hemoglobin 13.1  12.0 - 15.0 g/dL   HCT 38.8   36.0 - 46.0 %   MCV 93.0  78.0 - 100.0 fL   MCH 31.4  26.0 - 34.0 pg   MCHC 33.8  30.0 - 36.0 g/dL   RDW 13.5  11.5 - 15.5 %   Platelets 301  150 - 400 K/uL  HCG, QUANTITATIVE, PREGNANCY     Status: Abnormal   Collection Time    01/16/14  2:13 PM      Result Value Ref Range   hCG, Beta Chain, Quant, S 3980 (*) <5 mIU/mL   US Ob Comp Less 14 Wks  01/16/2014   CLINICAL DATA:  Abdominal pain, right greater than left. No vaginal bleeding.  EXAM: OBSTETRIC <14 WK [place] AND TRANSVAGINAL OB US  TECHNIQUE: Both transabdominal and transvaginal ultrasound examinations were performed for complete evaluation of the gestation as well as the maternal uterus, adnexal regions, and pelvic cul-de-sac. Transvaginal technique was performed to assess early pregnancy.  COMPARISON:  Pelvic ultrasound 10/04/2013  FINDINGS: Intrauterine gestational sac: Visualized. Possible small septation or small amount of debris within the intrauterine gestational sac. An additional yolk sac is felt to be less likely.  Yolk sac:  Present  Embryo:  Present  Cardiac Activity: Present  Heart Rate:  48  CRL:   2  mm   5 w 5d  Maternal uterus/adnexae: Probable corpus luteum within the right ovary. Left ovary is unremarkable. No significant free fluid. Small subchorionic hematoma measuring 1.3 cm.  IMPRESSION: Live intrauterine gestation approximately 5 weeks 5 days by crown-rump length. Note the crown-rump length is approximately 2 mm.  Suggestion of possible debris versus septation within the gestational sac. An additional yolk sac is felt to be less likely.  Recommend follow-up pelvic ultrasound in 7 -10 days for more accurate assessment of gestational dating and re-evaluation of the gestational sac.   Electronically Signed   By: Lovey Newcomer M.D.   On: 01/16/2014 15:26   US Ob Transvaginal  01/16/2014   CLINICAL DATA:  Abdominal pain, right greater than left. No vaginal bleeding.  EXAM: OBSTETRIC <14 WK [place] AND TRANSVAGINAL OB US  TECHNIQUE:  Both transabdominal and transvaginal ultrasound examinations were performed for complete evaluation of the gestation as well as the maternal uterus, adnexal regions, and pelvic cul-de-sac. Transvaginal technique was performed to assess early pregnancy.  COMPARISON:  Pelvic ultrasound 10/04/2013  FINDINGS: Intrauterine gestational sac: Visualized. Possible small septation or  small amount of debris within the intrauterine gestational sac. An additional yolk sac is felt to be less likely.  Yolk sac:  Present  Embryo:  Present  Cardiac Activity: Present  Heart Rate:  5  CRL:   2  mm   5 w 5d  Maternal uterus/adnexae: Probable corpus luteum within the right ovary. Left ovary is unremarkable. No significant free fluid. Small subchorionic hematoma measuring 1.3 cm.  IMPRESSION: Live intrauterine gestation approximately 5 weeks 5 days by crown-rump length. Note the crown-rump length is approximately 2 mm.  Suggestion of possible debris versus septation within the gestational sac. An additional yolk sac is felt to be less likely.  Recommend follow-up pelvic ultrasound in 7 -10 days for more accurate assessment of gestational dating and re-evaluation of the gestational sac.   Electronically Signed   By: Lovey Newcomer M.D.   On: 01/16/2014 15:26     MAU Course  Procedures  MDM Wet prep, GC, Chlamydia, CBC, UA, U/S, Quant Tylenol for pain/ last ultram was at 11 am from home Reviewed ultrasound results with Dr Harolyn Rutherford who felt she could go home and follow up with OB for repeat U/S  Assessment and Plan  A: Abdominal pain  P: Above orders Advised to stop Valium and consult MD on whether further Ultram is advised Stop NSAIDS/ May use tylenol as needed Establish prenatal care asap  Georgia Duff 01/16/2014, 2:01 PM

## 2014-01-16 NOTE — MAU Provider Note (Signed)

## 2014-01-17 LAB — CULTURE, OB URINE
COLONY COUNT: NO GROWTH
CULTURE: NO GROWTH
Special Requests: NORMAL

## 2014-01-18 LAB — GC/CHLAMYDIA PROBE AMP
CT Probe RNA: NEGATIVE
GC Probe RNA: NEGATIVE

## 2014-02-12 ENCOUNTER — Ambulatory Visit (HOSPITAL_COMMUNITY)
Admission: RE | Admit: 2014-02-12 | Discharge: 2014-02-12 | Disposition: A | Discharge status: Home / Self Care | Payer: Medicaid Other | Source: Ambulatory Visit | Attending: Obstetrics and Gynecology | Admitting: Obstetrics and Gynecology

## 2014-02-12 NOTE — Consult Note (Addendum)
MFM Consultation, Staff Note  Discussion:  By way of consultation, I spoke to your patient about her back pain and Ultram usage (Rx'd 2 tablets every 4-6 hours, #240 tablets per month with 5 refills).  She is currently taking a significant amount of ultram (240 tablets monthly).  While she would like to discontinue, I counseled her on risks of withdrawal including IUFD.  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  I also explained to her that at some point she may require either orthopedic surgery or neurosurgery/spinal surgery specialist regardless of pregnancy status if her ambulation/muscular strength/sensation become adversely affected.  At the point, she does not have an urgent need for surgery as there is no evidence of compromise in her gait/strength/sensation apparent grossly in the office today.  We discussed the potential effects/risk of Ultram of these medication on pregnancy and the fetus. Approximately 31 percent of neonates exposed to chronic narcotic usage have neonatal abstinence syndrome and some of these neonates need to be treated with opioid replacement therapy during the neonatal period. I explained many of the studies which have been done include people using heroin or large amounts of narcotics who require increased amounts of maintenance medication for treatment.  While her medication is Ultram, she is taking a significant amount, leading me to express that her baby will likely need to stay in the hospital after delivery for possible withdrawal.  When a neonate has withdrawal symptoms, it generally takes about 5-7 days to treat and get the neonate through the withdrawal period. I explained to the patient if she abruptly stops the narcotic during pregnancy  and has symptoms of withdrawal, she is at increased risk for an intrauterine fetal demise.  Nonetheless, because of the established need of narcotics to this point and the association of substance abuse with growth restriction, I recommend assessing interval growth every 4-6 weeks of the pregnancy in addition to the usual 1st trimester screen and 2nd trimester anatomy evaluation.  If there is issue of noncompliance, growth restriction or oligohydramnios, she would also need scheduling of weekly AFI and twice weekly NST.  I also recommend that she have consultation with NICU to discuss risks of neonatal withdrawal. In my opinion, the patient will be appropriate for referral to a pain management specialist now, leading me to call the [facility] physicians (Drs. Letta Pate and Naaman Plummer).  They report that they cannot see patients in pregnancy.  I did tell the patient that I had resources in [place] through [facility].  She desired all of her follow up to be in [place] citing that she wanted a singular physician/system having all of her care.  I let her know that she has a very good group taking care of her.  Nonetheless, she requested to have follow up with [facility], specifically Dr. Margurite Auerbach and pain management specialist at the [facility], Dr. Luretha Murphy.  I spoke with Dr. Tresa Moore personally and he agreed that the patient would benefit greatly from his involvement.  Summary of Recommendations:  1. Continue Ultram for now as prescribed 2. Pain Management Specialist referral (Dr. Luretha Murphy of the [facility]) 3. First trimester screen in 3 weeks 4. Second trimester anatomy at 18 weeks  5. noonatology consultation to discuss risk and management of neonatal withdrawal (antepartum consult is recommended for patient education) 6. Interval growth ultrasounds beginning at 24 weeks and every 4-6 weeks  thereafter 7. Patient requested transfer of care to [facility]/Dr. Margurite Auerbach (request being arranged).  Time Spent: I spent in excess of 60 minutes in consultation with this patient to review records, evaluate her case, and provide her with an adequate discussion and education.  More than 50% of this time was spent in direct face-to-face counseling. It was a pleasure seeing your patient in the office today.  Thank you for consultation. Please do not hesitate to contact our service for any further questions. Page with questions.  Eulis Foster, MD, MS, FACOG Assistant Professor, Maternal-Fetal Medicine

## 2014-03-03 ENCOUNTER — Inpatient Hospital Stay (HOSPITAL_COMMUNITY): Payer: Medicaid Other

## 2014-03-03 ENCOUNTER — Encounter (HOSPITAL_COMMUNITY): Payer: Self-pay

## 2014-03-03 ENCOUNTER — Inpatient Hospital Stay (HOSPITAL_COMMUNITY)
Admission: AD | Admit: 2014-03-03 | Discharge: 2014-03-03 | Disposition: A | Discharge status: Home / Self Care | Payer: Medicaid Other | Source: Ambulatory Visit | Attending: Obstetrics & Gynecology | Admitting: Obstetrics & Gynecology

## 2014-03-03 DIAGNOSIS — O021 Missed abortion: Secondary | ICD-10-CM | POA: Diagnosis not present

## 2014-03-03 DIAGNOSIS — G8929 Other chronic pain: Secondary | ICD-10-CM | POA: Diagnosis not present

## 2014-03-03 DIAGNOSIS — F17200 Nicotine dependence, unspecified, uncomplicated: Secondary | ICD-10-CM | POA: Diagnosis not present

## 2014-03-03 DIAGNOSIS — R109 Unspecified abdominal pain: Secondary | ICD-10-CM | POA: Diagnosis present

## 2014-03-03 DIAGNOSIS — M549 Dorsalgia, unspecified: Secondary | ICD-10-CM | POA: Diagnosis not present

## 2014-03-03 DIAGNOSIS — O034 Incomplete spontaneous abortion without complication: Secondary | ICD-10-CM

## 2014-03-03 LAB — URINALYSIS, ROUTINE W REFLEX MICROSCOPIC
Bilirubin Urine: NEGATIVE
Glucose, UA: NEGATIVE mg/dL
KETONES UR: NEGATIVE mg/dL
LEUKOCYTES UA: NEGATIVE
Nitrite: NEGATIVE
PH: 7.5 (ref 5.0–8.0)
PROTEIN: NEGATIVE mg/dL
Specific Gravity, Urine: 1.02 (ref 1.005–1.030)
Urobilinogen, UA: 0.2 mg/dL (ref 0.0–1.0)

## 2014-03-03 LAB — URINE MICROSCOPIC-ADD ON

## 2014-03-03 MED ORDER — HYDROCODONE-ACETAMINOPHEN 5-325 MG PO TABS: 1.0000 | ORAL_TABLET | ORAL | Status: DC | PRN

## 2014-03-03 MED ORDER — HYDROCODONE-ACETAMINOPHEN 5-325 MG PO TABS
2.0000 | ORAL_TABLET | Freq: Once | ORAL | Status: AC
  Administered 2014-03-03: 2 via ORAL
  Filled 2014-03-03: qty 2

## 2014-03-03 NOTE — Discharge Instructions (Signed)
Incomplete Miscarriage A miscarriage is the sudden loss of an unborn baby (fetus) before the 20th week of pregnancy. In an incomplete miscarriage, parts of the fetus or placenta (afterbirth) remain in the body.  Having a miscarriage can be an emotional experience. Talk with your health care provider about any questions you may have about miscarrying, the grieving process, and your future pregnancy plans. CAUSES   Problems with the fetal chromosomes that make it impossible for the baby to develop normally. Problems with the baby's genes or chromosomes are most often the result of errors that occur by chance as the embryo divides and grows. The problems are not inherited from the parents.  Infection of the cervix or uterus.  Hormone problems.  Problems with the cervix, such as having an incompetent cervix. This is when the tissue in the cervix is not strong enough to hold the pregnancy.  Problems with the uterus, such as an abnormally shaped uterus, uterine fibroids, or congenital abnormalities.  Certain medical conditions.  Smoking, drinking alcohol, or taking illegal drugs.  Trauma. SYMPTOMS   Vaginal bleeding or spotting, with or without cramps or pain.  Pain or cramping in the abdomen or lower back.  Passing fluid, tissue, or blood clots from the vagina. DIAGNOSIS  Your health care provider will perform a physical exam. You may also have an ultrasound to confirm the miscarriage. Blood or urine tests may also be ordered. TREATMENT   Usually, a dilation and curettage (D&C) procedure is performed. During a D&C procedure, the cervix is widened (dilated) and any remaining fetal or placental tissue is gently removed from the uterus.  Antibiotic medicines are prescribed if there is an infection. Other medicines may be given to reduce the size of the uterus (contract) if there is a lot of bleeding.  If you have Rh negative blood and your baby was Rh positive, you will need a Rho (D)  immune globulin shot. This shot will protect any future baby from having Rh blood problems in future pregnancies.  You may be confined to bed rest. This means you should stay in bed and only get up to use the bathroom. HOME CARE INSTRUCTIONS   Rest as directed by your health care provider.  Restrict activity as directed by your health care provider. You may be allowed to continue light activity if curettage was not done but you require further treatment.  Keep track of the number of pads you use each day. Keep track of how soaked (saturated) they are. Record this information.  Do not  use tampons.  Do not douche or have sexual intercourse until approved by your health care provider.  Keep all follow-up appointments for reevaluation and continuing management.  Only take over-the-counter or prescription medicines for pain, fever, or discomfort as directed by your health care provider.  Take antibiotic medicine as directed by your health care provider. Make sure you finish it even if you start to feel better. SEEK IMMEDIATE MEDICAL CARE IF:   You experience severe cramps in your stomach, back, or abdomen.  You have an unexplained temperature (make sure to record these temperatures).  You pass large clots or tissue (save these for your health care provider to inspect).  Your bleeding increases.  You become light-headed, weak, or have fainting episodes. MAKE SURE YOU:   Understand these instructions.  Will watch your condition.  Will get help right away if you are not doing well or get worse. Document Released: 07/09/2005 Document Revised: 11/23/2013 Document Reviewed:   02/05/2013 ExitCare Patient Information 2015 [employer]. This information is not intended to replace advice given to you by your health care provider. Make sure you discuss any questions you have with your health care provider.  

## 2014-03-03 NOTE — MAU Note (Signed)
Pain in lower abd for past 3 days, pain is getting worse. Taking Tramadol for back injury is not helping, can't sleep.  Started seeing small black spots in underwear 3 days ago.

## 2014-03-03 NOTE — MAU Provider Note (Signed)
History     CSN: [id number]  Arrival date and time: 03/03/14 1328   None     Chief Complaint  Patient presents with  . Abdominal Pain   HPI  Connie Bryan is a 43 y.o. female [id number] at [redacted]w[redacted]d who presents to MAU with complaints of abdominal pain. The pain started 3 days ago and has progressively gotten worse. She took tramadol this morning and that helped some. She has an RX for tramadol for chronic back pain. The patient is scheduled next week for her first prenatal appointment in [facility]. She denies vaginal bleeding, however noticed some "black spots" in her discharge.  OB History   Grav Para Term Preterm Abortions TAB SAB Ect Mult Living   5 4 4  0 0 0 0 0 0 4      No past medical history on file.  Past Surgical History  Procedure Laterality Date  . Cesarean section    . Cholecystectomy      History reviewed. No pertinent family history.  History  Substance Use Topics  . Smoking status: Current Every Day Smoker    Types: Cigarettes  . Smokeless tobacco: Never Used  . Alcohol Use: Yes     Comment: occasional    Allergies: No Known Allergies  Prescriptions prior to admission  Medication Sig Dispense Refill  . traMADol (ULTRAM) 50 MG tablet TAKE ONE OR TWO TABLETS 4 TIMES DAILY  240 tablet  5   Results for orders placed during the hospital encounter of 03/03/14 (from the past 48 hour(s))  URINALYSIS, ROUTINE W REFLEX MICROSCOPIC     Status: Abnormal   Collection Time    03/03/14  2:10 PM      Result Value Ref Range   Color, Urine YELLOW  YELLOW   APPearance CLEAR  CLEAR   Specific Gravity, Urine 1.020  1.005 - 1.030   pH 7.5  5.0 - 8.0   Glucose, UA NEGATIVE  NEGATIVE mg/dL   Hgb urine dipstick MODERATE (*) NEGATIVE   Bilirubin Urine NEGATIVE  NEGATIVE   Ketones, ur NEGATIVE  NEGATIVE mg/dL   Protein, ur NEGATIVE  NEGATIVE mg/dL   Urobilinogen, UA 0.2  0.0 - 1.0 mg/dL   Nitrite NEGATIVE  NEGATIVE   Leukocytes, UA NEGATIVE  NEGATIVE  URINE  MICROSCOPIC-ADD ON     Status: Abnormal   Collection Time    03/03/14  2:10 PM      Result Value Ref Range   Squamous Epithelial / LPF RARE  RARE   WBC, UA 0-2  <3 WBC/hpf   RBC / HPF 21-50  <3 RBC/hpf   Bacteria, UA MANY (*) RARE   Urine-Other MUCOUS PRESENT     US Ob Comp Less 14 Wks  03/03/2014   CLINICAL DATA:  Absent fetal heart tones.  EXAM: OBSTETRIC <14 WK [place] AND TRANSVAGINAL OB US  TECHNIQUE: Both transabdominal and transvaginal ultrasound examinations were performed for complete evaluation of the gestation as well as the maternal uterus, adnexal regions, and pelvic cul-de-sac. Transvaginal technique was performed to assess early pregnancy.  COMPARISON:  01/16/2014  FINDINGS: Intrauterine gestational sac: Mildly irregular.  Yolk sac:  None  Embryo:  No embryo seen.  MSD:  31  mm   8 w   3  d  Maternal uterus/adnexae: Small subchronic hemorrhage measuring 12 mm x 6 mm x 10 mm. No uterine mass. No adnexal mass. Ovaries are unremarkable. No pelvic free fluid.  IMPRESSION: 1. Failed intrauterine pregnancy. There is  a gestational type sac within the uterus, but no embryo is now present. There was an embryo on the prior ultrasound. Findings meet definitive criteria for failed pregnancy. This follows SRU consensus guidelines: Diagnostic Criteria for Nonviable Pregnancy Early in the First Trimester. Alison Stalling J Med [phone]. 2. Small subchronic hemorrhage measuring 12 mm in greatest dimension. 3. No ovarian or adnexal abnormality.   Electronically Signed   By: Lajean Manes M.D.   On: 03/03/2014 15:38   US Ob Transvaginal  03/03/2014   CLINICAL DATA:  Absent fetal heart tones.  EXAM: OBSTETRIC <14 WK [place] AND TRANSVAGINAL OB US  TECHNIQUE: Both transabdominal and transvaginal ultrasound examinations were performed for complete evaluation of the gestation as well as the maternal uterus, adnexal regions, and pelvic cul-de-sac. Transvaginal technique was performed to assess early pregnancy.   COMPARISON:  01/16/2014  FINDINGS: Intrauterine gestational sac: Mildly irregular.  Yolk sac:  None  Embryo:  No embryo seen.  MSD:  31  mm   8 w   3  d  Maternal uterus/adnexae: Small subchronic hemorrhage measuring 12 mm x 6 mm x 10 mm. No uterine mass. No adnexal mass. Ovaries are unremarkable. No pelvic free fluid.  IMPRESSION: 1. Failed intrauterine pregnancy. There is a gestational type sac within the uterus, but no embryo is now present. There was an embryo on the prior ultrasound. Findings meet definitive criteria for failed pregnancy. This follows SRU consensus guidelines: Diagnostic Criteria for Nonviable Pregnancy Early in the First Trimester. Alison Stalling J Med [id number]. 2. Small subchronic hemorrhage measuring 12 mm in greatest dimension. 3. No ovarian or adnexal abnormality.   Electronically Signed   By: Lajean Manes M.D.   On: 03/03/2014 15:38    Review of Systems  Constitutional: Positive for chills. Negative for fever.  Gastrointestinal: Positive for abdominal pain (Bilateral lower abdominal pain ).  Genitourinary:       + vaginal discharge; black spots in her discharge.  No vaginal bleeding. No dysuria.    Physical Exam   Blood pressure 112/61, pulse 79, temperature 98.1 F (36.7 C), temperature source Oral, resp. rate 18, height 5' 6.75" (1.695 m), weight 104.327 kg (230 lb), last menstrual period 12/03/2013.  Physical Exam  Constitutional: She is oriented to person, place, and time. She appears well-developed and well-nourished. No distress.  HENT:  Head: Normocephalic.  Eyes: Pupils are equal, round, and reactive to light.  Neck: Neck supple.  Respiratory: Effort normal.  GI: Soft. She exhibits no distension. There is tenderness (Bilateral lower abdominal tenderness ). There is no rebound and no guarding.  Musculoskeletal: Normal range of motion.  Neurological: She is alert and oriented to person, place, and time.  Skin: Skin is warm. She is not diaphoretic.     MAU Course  Procedures None  MDM US shows [redacted]w[redacted]d failed pregnancy  Discussed patient with Dr. Roselie Awkward. Pt desires D & C; all options discussed with patient; expectant management, cytotec, D & C.  Dr. Roselie Awkward will arrange for the patient to be scheduled for surgery; pt will be notifed via telephone  B positive blood type  Urine culture sent  Pt voices concern regarding leaving the hospital with this diagnoses. She would like to have her D&C done today. I discussed with her that because this is not an emergent situation it would be best to have a scheduled procedure. Pt voices understanding.  Assessment and Plan   A:  Failed pregnancy 8w 3d  P:  Discharge home in stable  condition RX: Vicodin for pain Ok to take ibuprofen as needed, as directed on the bottle Return to MAU with any vaginal bleeding or increased pain The OR will call you to schedule the [facility] Support given    Darrelyn Hillock Leighla Chestnutt, NP  03/03/2014, 8:02 PM

## 2014-03-04 LAB — URINE CULTURE

## 2014-03-05 ENCOUNTER — Encounter (HOSPITAL_COMMUNITY): Payer: Self-pay | Admitting: *Deleted

## 2014-03-05 ENCOUNTER — Ambulatory Visit (HOSPITAL_COMMUNITY)
Admission: RE | Admit: 2014-03-05 | Payer: Medicaid Other | Source: Ambulatory Visit | Admitting: Obstetrics & Gynecology

## 2014-03-05 ENCOUNTER — Ambulatory Visit (HOSPITAL_COMMUNITY)
Admission: AD | Admit: 2014-03-05 | Discharge: 2014-03-05 | Disposition: A | Discharge status: Home / Self Care | Payer: Medicaid Other | Source: Ambulatory Visit | Attending: Obstetrics and Gynecology | Admitting: Obstetrics and Gynecology

## 2014-03-05 ENCOUNTER — Encounter (HOSPITAL_COMMUNITY)
Admission: AD | Disposition: A | Discharge status: Home / Self Care | Payer: Self-pay | Source: Ambulatory Visit | Attending: Obstetrics and Gynecology

## 2014-03-05 ENCOUNTER — Inpatient Hospital Stay (HOSPITAL_COMMUNITY): Payer: Medicaid Other | Admitting: Anesthesiology

## 2014-03-05 ENCOUNTER — Encounter (HOSPITAL_COMMUNITY): Payer: Medicaid Other | Admitting: Anesthesiology

## 2014-03-05 DIAGNOSIS — F17200 Nicotine dependence, unspecified, uncomplicated: Secondary | ICD-10-CM | POA: Insufficient documentation

## 2014-03-05 DIAGNOSIS — O021 Missed abortion: Secondary | ICD-10-CM | POA: Diagnosis not present

## 2014-03-05 HISTORY — PX: DILATION AND CURETTAGE OF UTERUS: SHX78

## 2014-03-05 LAB — CBC
HEMATOCRIT: 39.2 % (ref 36.0–46.0)
HEMOGLOBIN: 13.2 g/dL (ref 12.0–15.0)
MCH: 31 pg (ref 26.0–34.0)
MCHC: 33.7 g/dL (ref 30.0–36.0)
MCV: 92 fL (ref 78.0–100.0)
Platelets: 294 10*3/uL (ref 150–400)
RBC: 4.26 MIL/uL (ref 3.87–5.11)
RDW: 13.5 % (ref 11.5–15.5)
WBC: 6.9 10*3/uL (ref 4.0–10.5)

## 2014-03-05 LAB — TYPE AND SCREEN
ABO/RH(D): B POS
Antibody Screen: NEGATIVE

## 2014-03-05 LAB — RPR

## 2014-03-05 SURGERY — DILATION AND CURETTAGE
Anesthesia: Monitor Anesthesia Care | Site: Vagina

## 2014-03-05 MED ORDER — FENTANYL CITRATE 0.05 MG/ML IJ SOLN: 25.0000 ug | INTRAMUSCULAR | Status: DC | PRN

## 2014-03-05 MED ORDER — PROPOFOL INFUSION 10 MG/ML OPTIME
INTRAVENOUS | Status: DC | PRN
  Administered 2014-03-05: 100 ug/kg/min via INTRAVENOUS

## 2014-03-05 MED ORDER — LACTATED RINGERS IV BOLUS (SEPSIS)
1000.0000 mL | Freq: Once | INTRAVENOUS | Status: AC
  Administered 2014-03-05: 1000 mL via INTRAVENOUS

## 2014-03-05 MED ORDER — KETOROLAC TROMETHAMINE 30 MG/ML IJ SOLN
INTRAMUSCULAR | Status: DC | PRN
  Administered 2014-03-05: 30 mg via INTRAVENOUS

## 2014-03-05 MED ORDER — ONDANSETRON HCL 4 MG/2ML IJ SOLN: 4.0000 mg | Freq: Once | INTRAMUSCULAR | Status: DC | PRN

## 2014-03-05 MED ORDER — BUPIVACAINE HCL (PF) 0.5 % IJ SOLN
INTRAMUSCULAR | Status: DC | PRN
  Administered 2014-03-05: 20 mL

## 2014-03-05 MED ORDER — DEXAMETHASONE SODIUM PHOSPHATE 10 MG/ML IJ SOLN
INTRAMUSCULAR | Status: DC | PRN
  Administered 2014-03-05: 4 mg via INTRAVENOUS

## 2014-03-05 MED ORDER — LIDOCAINE HCL (CARDIAC) 20 MG/ML IV SOLN
INTRAVENOUS | Status: DC | PRN
  Administered 2014-03-05: 100 mg via INTRAVENOUS

## 2014-03-05 MED ORDER — CITRIC ACID-SODIUM CITRATE 334-500 MG/5ML PO SOLN
30.0000 mL | Freq: Once | ORAL | Status: AC
  Administered 2014-03-05: 30 mL via ORAL
  Filled 2014-03-05: qty 15

## 2014-03-05 MED ORDER — MEPERIDINE HCL 25 MG/ML IJ SOLN
6.2500 mg | INTRAMUSCULAR | Status: DC | PRN
Start: 2014-03-05 — End: 2014-03-05

## 2014-03-05 MED ORDER — MIDAZOLAM HCL 2 MG/2ML IJ SOLN
INTRAMUSCULAR | Status: DC | PRN
  Administered 2014-03-05: 2 mg via INTRAVENOUS

## 2014-03-05 MED ORDER — DOCUSATE SODIUM 100 MG PO CAPS: 100.0000 mg | ORAL_CAPSULE | Freq: Two times a day (BID) | ORAL | Status: DC | PRN

## 2014-03-05 MED ORDER — LACTATED RINGERS IV SOLN
INTRAVENOUS | Status: DC
  Administered 2014-03-05: 13:00:00 via INTRAVENOUS

## 2014-03-05 MED ORDER — PROPOFOL 10 MG/ML IV BOLUS
INTRAVENOUS | Status: DC | PRN
  Administered 2014-03-05: 80 mg via INTRAVENOUS

## 2014-03-05 MED ORDER — GLYCOPYRROLATE 0.2 MG/ML IJ SOLN
INTRAMUSCULAR | Status: DC | PRN
  Administered 2014-03-05: 0.2 mg via INTRAVENOUS

## 2014-03-05 MED ORDER — FENTANYL CITRATE 0.05 MG/ML IJ SOLN
INTRAMUSCULAR | Status: DC | PRN
  Administered 2014-03-05 (×2): 50 ug via INTRAVENOUS
  Administered 2014-03-05: 100 ug via INTRAVENOUS

## 2014-03-05 MED ORDER — HYDROCODONE-ACETAMINOPHEN 5-325 MG PO TABS: 1.0000 | ORAL_TABLET | ORAL | Status: DC | PRN

## 2014-03-05 MED ORDER — BUPIVACAINE HCL (PF) 0.5 % IJ SOLN
INTRAMUSCULAR | Status: AC
  Filled 2014-03-05: qty 30

## 2014-03-05 MED ORDER — ONDANSETRON HCL 4 MG/2ML IJ SOLN
INTRAMUSCULAR | Status: DC | PRN
  Administered 2014-03-05: 4 mg via INTRAVENOUS

## 2014-03-05 MED ORDER — FAMOTIDINE IN NACL 20-0.9 MG/50ML-% IV SOLN
20.0000 mg | Freq: Once | INTRAVENOUS | Status: AC
  Administered 2014-03-05: 20 mg via INTRAVENOUS
  Filled 2014-03-05: qty 50

## 2014-03-05 MED ORDER — KETOROLAC TROMETHAMINE 30 MG/ML IJ SOLN: 15.0000 mg | Freq: Once | INTRAMUSCULAR | Status: DC | PRN

## 2014-03-05 SURGICAL SUPPLY — 14 items
CATH ROBINSON RED A/P 16FR (CATHETERS) ×2 IMPLANT
DECANTER SPIKE VIAL GLASS SM (MISCELLANEOUS) ×2 IMPLANT
GLOVE BIOGEL PI IND STRL 6.5 (GLOVE) ×3 IMPLANT
GLOVE BIOGEL PI INDICATOR 6.5 (GLOVE) ×3
GLOVE SURG SS PI 6.0 STRL IVOR (GLOVE) ×4 IMPLANT
GOWN STRL REUS W/TWL LRG LVL3 (GOWN DISPOSABLE) ×6 IMPLANT
NS IRRIG 1000ML POUR BTL (IV SOLUTION) ×2 IMPLANT
PACK VAGINAL MINOR WOMEN LF (CUSTOM PROCEDURE TRAY) ×2 IMPLANT
PAD OB MATERNITY 4.3X12.25 (PERSONAL CARE ITEMS) ×2 IMPLANT
PAD PREP 24X48 CUFFED NSTRL (MISCELLANEOUS) ×2 IMPLANT
SET BERKELEY SUCTION TUBING (SUCTIONS) ×2 IMPLANT
TOWEL OR 17X24 6PK STRL BLUE (TOWEL DISPOSABLE) ×4 IMPLANT
VACURETTE 9 RIGID CVD (CANNULA) ×2 IMPLANT
WATER STERILE IRR 1000ML POUR (IV SOLUTION) ×2 IMPLANT

## 2014-03-05 NOTE — Anesthesia Postprocedure Evaluation (Signed)
Anesthesia Post Note  Patient: Connie Bryan  Procedure(s) Performed: Procedure(s) (LRB): DILATATION AND EVACUATION (N/A)  Anesthesia type: General  Patient location: PACU  Post pain: Pain level controlled  Post assessment: Post-op Vital signs reviewed  Last Vitals:  Filed Vitals:   03/05/14 1445  BP: 104/56  Pulse: 62  Temp:   Resp: 20    Post vital signs: Reviewed  Level of consciousness: sedated  Complications: No apparent anesthesia complications

## 2014-03-05 NOTE — Anesthesia Preprocedure Evaluation (Signed)
Anesthesia Evaluation  Patient identified by MRN, date of birth, ID band Patient awake    Reviewed: Allergy & Precautions, H&P , NPO status , Patient's Chart, lab work & pertinent test results  Airway Mallampati: III TM Distance: >3 FB Neck ROM: Full    Dental no notable dental hx. (+) Teeth Intact   Pulmonary Current Smoker,  breath sounds clear to auscultation  Pulmonary exam normal       Cardiovascular negative cardio ROS  Rhythm:Regular Rate:Normal     Neuro/Psych negative neurological ROS  negative psych ROS   GI/Hepatic Neg liver ROS,   Endo/Other  negative endocrine ROS  Renal/GU negative Renal ROS  negative genitourinary   Musculoskeletal negative musculoskeletal ROS (+)   Abdominal (+) + obese,   Peds  Hematology negative hematology ROS (+)   Anesthesia Other Findings   Reproductive/Obstetrics (+) Pregnancy Missed Ab 13 weeks                           Anesthesia Physical Anesthesia Plan  ASA: II  Anesthesia Plan: MAC   Post-op Pain Management:    Induction: Intravenous  Airway Management Planned: Natural Airway and Simple Face Mask  Additional Equipment:   Intra-op Plan:   Post-operative Plan:   Informed Consent: I have reviewed the patients History and Physical, chart, labs and discussed the procedure including the risks, benefits and alternatives for the proposed anesthesia with the patient or authorized representative who has indicated his/her understanding and acceptance.     Plan Discussed with: CRNA, Anesthesiologist and Surgeon  Anesthesia Plan Comments:         Anesthesia Quick Evaluation

## 2014-03-05 NOTE — Transfer of Care (Signed)
Immediate Anesthesia Transfer of Care Note  Patient: Connie Bryan  Procedure(s) Performed: Procedure(s): DILATATION AND EVACUATION (N/A)  Patient Location: PACU  Anesthesia Type:MAC  Level of Consciousness: awake, alert  and oriented  Airway & Oxygen Therapy: Patient Spontanous Breathing and Patient connected to nasal cannula oxygen  Post-op Assessment: Report given to PACU RN and Post -op Vital signs reviewed and stable  Post vital signs: Reviewed and stable  Complications: No apparent anesthesia complications

## 2014-03-05 NOTE — H&P (Signed)
Connie Bryan is an 43 y.o. female G5P4 with missed abortion here for scheduled dilatation and evacuation. Patient is 12 weeks by dates but recent ultrasound on 8/12 demonstrated a gestational sac measuring 8 weeks without an embryo. Patient reports severe cramping pain but no vaginal bleeding since 8/12.   Menstrual History: Patient's last menstrual period was 12/03/2013.    No past medical history on file.  Past Surgical History  Procedure Laterality Date  . Cesarean section    . Cholecystectomy      No family history on file.  Social History:  reports that she has been smoking Cigarettes.  She has been smoking about 0.00 packs per day. She has never used smokeless tobacco. She reports that she drinks alcohol. She reports that she does not use illicit drugs.  Allergies: No Known Allergies  Prescriptions prior to admission  Medication Sig Dispense Refill  . Doxylamine-Pyridoxine (DICLEGIS) 10-10 MG TBEC Take 2 tablets by mouth at bedtime as needed (nausea).      Marland Kitchen HYDROcodone-acetaminophen (NORCO/VICODIN) 5-325 MG per tablet Take 1-2 tablets by mouth every 4 (four) hours as needed for moderate pain or severe pain.  15 tablet  0  . promethazine (PHENERGAN) 25 MG tablet Take 25 mg by mouth every 4 (four) hours as needed for nausea or vomiting.      . traMADol (ULTRAM) 50 MG tablet Take 50 mg by mouth every 6 (six) hours as needed for moderate pain.        Review of Systems  All other systems reviewed and are negative.   Last menstrual period 12/03/2013. Physical Exam GENERAL: Well-developed, well-nourished female in no acute distress.  HEENT: Normocephalic, atraumatic. Sclerae anicteric.  NECK: Supple. Normal thyroid.  LUNGS: Clear to auscultation bilaterally.  HEART: Regular rate and rhythm. ABDOMEN: Soft, nontender, nondistended. No organomegaly. PELVIC: Deferred to OR EXTREMITIES: No cyanosis, clubbing, or edema, 2+ distal pulses.  Results for orders placed during the  hospital encounter of 03/05/14 (from the past 24 hour(s))  CBC     Status: None   Collection Time    03/05/14 12:48 PM      Result Value Ref Range   WBC 6.9  4.0 - 10.5 K/uL   RBC 4.26  3.87 - 5.11 MIL/uL   Hemoglobin 13.2  12.0 - 15.0 g/dL   HCT 39.2  36.0 - 46.0 %   MCV 92.0  78.0 - 100.0 fL   MCH 31.0  26.0 - 34.0 pg   MCHC 33.7  30.0 - 36.0 g/dL   RDW 13.5  11.5 - 15.5 %   Platelets 294  150 - 400 K/uL    US Ob Comp Less 14 Wks  03/03/2014   CLINICAL DATA:  Absent fetal heart tones.  EXAM: OBSTETRIC <14 WK [place] AND TRANSVAGINAL OB US  TECHNIQUE: Both transabdominal and transvaginal ultrasound examinations were performed for complete evaluation of the gestation as well as the maternal uterus, adnexal regions, and pelvic cul-de-sac. Transvaginal technique was performed to assess early pregnancy.  COMPARISON:  01/16/2014  FINDINGS: Intrauterine gestational sac: Mildly irregular.  Yolk sac:  None  Embryo:  No embryo seen.  MSD:  31  mm   8 w   3  d  Maternal uterus/adnexae: Small subchronic hemorrhage measuring 12 mm x 6 mm x 10 mm. No uterine mass. No adnexal mass. Ovaries are unremarkable. No pelvic free fluid.  IMPRESSION: 1. Failed intrauterine pregnancy. There is a gestational type sac within the uterus, but no embryo is  now present. There was an embryo on the prior ultrasound. Findings meet definitive criteria for failed pregnancy. This follows SRU consensus guidelines: Diagnostic Criteria for Nonviable Pregnancy Early in the First Trimester. Alison Stalling J Med [phone]. 2. Small subchronic hemorrhage measuring 12 mm in greatest dimension. 3. No ovarian or adnexal abnormality.   Electronically Signed   By: Lajean Manes M.D.   On: 03/03/2014 15:38   US Ob Transvaginal  03/03/2014   CLINICAL DATA:  Absent fetal heart tones.  EXAM: OBSTETRIC <14 WK [place] AND TRANSVAGINAL OB US  TECHNIQUE: Both transabdominal and transvaginal ultrasound examinations were performed for complete evaluation of the  gestation as well as the maternal uterus, adnexal regions, and pelvic cul-de-sac. Transvaginal technique was performed to assess early pregnancy.  COMPARISON:  01/16/2014  FINDINGS: Intrauterine gestational sac: Mildly irregular.  Yolk sac:  None  Embryo:  No embryo seen.  MSD:  31  mm   8 w   3  d  Maternal uterus/adnexae: Small subchronic hemorrhage measuring 12 mm x 6 mm x 10 mm. No uterine mass. No adnexal mass. Ovaries are unremarkable. No pelvic free fluid.  IMPRESSION: 1. Failed intrauterine pregnancy. There is a gestational type sac within the uterus, but no embryo is now present. There was an embryo on the prior ultrasound. Findings meet definitive criteria for failed pregnancy. This follows SRU consensus guidelines: Diagnostic Criteria for Nonviable Pregnancy Early in the First Trimester. Alison Stalling J Med [phone]. 2. Small subchronic hemorrhage measuring 12 mm in greatest dimension. 3. No ovarian or adnexal abnormality.   Electronically Signed   By: Lajean Manes M.D.   On: 03/03/2014 15:38    Assessment/Plan: 43 yo G5P4 with missed abortion here for dilatation and evacuation - Risks, benefits and alternatives were explained including but not limited to risks of bleeding, infection, uterine perforation and damage to adjacent organs. Patient verbalized understanding and all questions were answered.  Ady Heimann 03/05/2014, 1:16 PM

## 2014-03-05 NOTE — MAU Note (Signed)
Patient in MAU for OR preparation.

## 2014-03-05 NOTE — Op Note (Signed)
Francille Thaxton PROCEDURE DATE: 03/05/2014  PREOPERATIVE DIAGNOSIS: 8 week missed abortion. POSTOPERATIVE DIAGNOSIS: The same. PROCEDURE:     Dilation and Evacuation. SURGEON:  Dr. Mora Bellman  INDICATIONS: 43 y.o. G5P4004with MAB at [redacted] weeks gestation, needing surgical completion.  Risks of surgery were discussed with the patient including but not limited to: bleeding which may require transfusion; infection which may require antibiotics; injury to uterus or surrounding organs;need for additional procedures including laparotomy or laparoscopy; possibility of intrauterine scarring which may impair future fertility; and other postoperative/anesthesia complications. Written informed consent was obtained.    FINDINGS:  A 10 week size anteverted uterus, moderate amounts of products of conception, specimen sent to pathology.  ANESTHESIA:    Monitored intravenous sedation, paracervical block. INTRAVENOUS FLUIDS:  800 ml of LR ESTIMATED BLOOD LOSS:  Less than 20 ml. SPECIMENS:  Products of conception sent to pathology COMPLICATIONS:  None immediate.  PROCEDURE DETAILS:  The patient was taken to the operating room where general anesthesia was administered and was found to be adequate.  After an adequate timeout was performed, she was placed in the dorsal lithotomy position and examined; then prepped and draped in the sterile manner.   Her bladder was catheterized for an unmeasured amount of clear, yellow urine. A vaginal speculum was then placed in the patient's vagina and a single tooth tenaculum was applied to the anterior lip of the cervix.  A paracervical block using 0.5% Marcaine was administered. The cervix was gently dilated to accommodate a 9 mm suction curette that was gently advanced to the uterine fundus.  The suction device was then activated and curette slowly rotated to clear the uterus of products of conception.  A sharp curettage was then performed to confirm complete emptying of the uterus.  There was minimal bleeding noted and the tenaculum removed with good hemostasis noted.   All instruments were removed from the patient's vagina. The patient tolerated the procedure well and was taken to the recovery area awake, and in stable condition.  The patient will be discharged to home as per PACU criteria.  Routine postoperative instructions given.  She was prescribed Percocet, Ibuprofen and Colace.  She will follow up in the clinic in 2 weeks for postoperative evaluation.

## 2014-03-05 NOTE — Discharge Instructions (Signed)
DISCHARGE INSTRUCTIONS: D&C / D&E The following instructions have been prepared to help you care for yourself upon your return home.  May take ibuprofen after 8:00pm as needed for pain   Personal hygiene:  Use sanitary pads for vaginal drainage, not tampons.  Shower the day after your procedure.  NO tub baths, pools or Jacuzzis for 2-3 weeks.  Wipe front to back after using the bathroom.  Activity and limitations:  Do NOT drive or operate any equipment for 24 hours. The effects of anesthesia are still present and drowsiness may result.  Do NOT rest in bed all day.  Walking is encouraged.  Walk up and down stairs slowly.  You may resume your normal activity in one to two days or as indicated by your physician.  Sexual activity: NO intercourse for at least 2 weeks after the procedure, or as indicated by your physician.  Diet: Eat a light meal as desired this evening. You may resume your usual diet tomorrow.  Return to work: You may resume your work activities in one to two days or as indicated by your doctor.  What to expect after your surgery: Expect to have vaginal bleeding/discharge for 2-3 days and spotting for up to 10 days. It is not unusual to have soreness for up to 1-2 weeks. You may have a slight burning sensation when you urinate for the first day. Mild cramps may continue for a couple of days. You may have a regular period in 2-6 weeks.  Call your doctor for any of the following:  Excessive vaginal bleeding, saturating and changing one pad every hour.  Inability to urinate 6 hours after discharge from hospital.  Pain not relieved by pain medication.  Fever of 100.4 F or greater.  Unusual vaginal discharge or odor.

## 2014-03-08 ENCOUNTER — Encounter (HOSPITAL_COMMUNITY): Payer: Self-pay | Admitting: Obstetrics and Gynecology

## 2014-03-08 SURGERY — Surgical Case
Anesthesia: *Unknown

## 2014-03-31 ENCOUNTER — Ambulatory Visit: Payer: Medicaid Other | Admitting: Obstetrics and Gynecology

## 2014-04-18 ENCOUNTER — Emergency Department (HOSPITAL_COMMUNITY): Payer: Medicaid Other

## 2014-04-18 ENCOUNTER — Encounter (HOSPITAL_COMMUNITY): Payer: Self-pay | Admitting: Emergency Medicine

## 2014-04-18 ENCOUNTER — Emergency Department (HOSPITAL_COMMUNITY)
Admission: EM | Admit: 2014-04-18 | Discharge: 2014-04-18 | Disposition: A | Discharge status: Home / Self Care | Payer: Medicaid Other | Attending: Emergency Medicine | Admitting: Emergency Medicine

## 2014-04-18 DIAGNOSIS — R51 Headache: Secondary | ICD-10-CM | POA: Insufficient documentation

## 2014-04-18 DIAGNOSIS — Z3202 Encounter for pregnancy test, result negative: Secondary | ICD-10-CM | POA: Diagnosis not present

## 2014-04-18 DIAGNOSIS — R519 Headache, unspecified: Secondary | ICD-10-CM

## 2014-04-18 DIAGNOSIS — F17200 Nicotine dependence, unspecified, uncomplicated: Secondary | ICD-10-CM | POA: Diagnosis not present

## 2014-04-18 LAB — COMPREHENSIVE METABOLIC PANEL
ALK PHOS: 77 U/L (ref 39–117)
ALT: 14 U/L (ref 0–35)
ANION GAP: 10 (ref 5–15)
AST: 16 U/L (ref 0–37)
Albumin: 4.1 g/dL (ref 3.5–5.2)
BUN: 16 mg/dL (ref 6–23)
CO2: 28 meq/L (ref 19–32)
Calcium: 9.1 mg/dL (ref 8.4–10.5)
Chloride: 102 mEq/L (ref 96–112)
Creatinine, Ser: 0.72 mg/dL (ref 0.50–1.10)
GFR calc non Af Amer: >90 mL/min (ref >90)
GLUCOSE: 79 mg/dL (ref 70–99)
POTASSIUM: 4.4 meq/L (ref 3.7–5.3)
Sodium: 140 mEq/L (ref 137–147)
TOTAL PROTEIN: 7.7 g/dL (ref 6.0–8.3)
Total Bilirubin: <0.2 mg/dL — ABNORMAL LOW (ref 0.3–1.2)

## 2014-04-18 LAB — CBC WITH DIFFERENTIAL/PLATELET
BASOS PCT: 0 % (ref 0–1)
Basophils Absolute: 0 10*3/uL (ref 0.0–0.1)
Eosinophils Absolute: 0.1 10*3/uL (ref 0.0–0.7)
Eosinophils Relative: 2 % (ref 0–5)
HCT: 43.3 % (ref 36.0–46.0)
HEMOGLOBIN: 14.7 g/dL (ref 12.0–15.0)
Lymphocytes Relative: 37 % (ref 12–46)
Lymphs Abs: 3 10*3/uL (ref 0.7–4.0)
MCH: 31 pg (ref 26.0–34.0)
MCHC: 33.9 g/dL (ref 30.0–36.0)
MCV: 91.4 fL (ref 78.0–100.0)
MONOS PCT: 5 % (ref 3–12)
Monocytes Absolute: 0.4 10*3/uL (ref 0.1–1.0)
NEUTROS ABS: 4.5 10*3/uL (ref 1.7–7.7)
NEUTROS PCT: 56 % (ref 43–77)
PLATELETS: 344 10*3/uL (ref 150–400)
RBC: 4.74 MIL/uL (ref 3.87–5.11)
RDW: 13 % (ref 11.5–15.5)
WBC: 8 10*3/uL (ref 4.0–10.5)

## 2014-04-18 LAB — URINALYSIS, ROUTINE W REFLEX MICROSCOPIC
BILIRUBIN URINE: NEGATIVE
Glucose, UA: NEGATIVE mg/dL
KETONES UR: NEGATIVE mg/dL
Leukocytes, UA: NEGATIVE
NITRITE: NEGATIVE
Protein, ur: NEGATIVE mg/dL
Specific Gravity, Urine: 1.007 (ref 1.005–1.030)
Urobilinogen, UA: 0.2 mg/dL (ref 0.0–1.0)
pH: 6.5 (ref 5.0–8.0)

## 2014-04-18 LAB — URINE MICROSCOPIC-ADD ON

## 2014-04-18 LAB — TSH: TSH: 1.27 u[IU]/mL (ref 0.350–4.500)

## 2014-04-18 LAB — POC URINE PREG, ED: Preg Test, Ur: NEGATIVE

## 2014-04-18 MED ORDER — SODIUM CHLORIDE 0.9 % IV SOLN
1000.0000 mL | INTRAVENOUS | Status: DC
  Administered 2014-04-18: 1000 mL via INTRAVENOUS

## 2014-04-18 MED ORDER — SODIUM CHLORIDE 0.9 % IV SOLN
1000.0000 mL | Freq: Once | INTRAVENOUS | Status: AC
  Administered 2014-04-18: 1000 mL via INTRAVENOUS

## 2014-04-18 MED ORDER — ONDANSETRON 4 MG PO TBDP: 4.0000 mg | ORAL_TABLET | ORAL | Status: DC | PRN

## 2014-04-18 MED ORDER — TRAMADOL HCL 50 MG PO TABS: 50.0000 mg | ORAL_TABLET | Freq: Four times a day (QID) | ORAL | Status: DC | PRN

## 2014-04-18 MED ORDER — DIPHENHYDRAMINE HCL 50 MG/ML IJ SOLN
25.0000 mg | Freq: Once | INTRAMUSCULAR | Status: AC
  Administered 2014-04-18: 25 mg via INTRAVENOUS
  Filled 2014-04-18: qty 1

## 2014-04-18 MED ORDER — MORPHINE SULFATE 4 MG/ML IJ SOLN
4.0000 mg | Freq: Once | INTRAMUSCULAR | Status: AC
  Administered 2014-04-18: 4 mg via INTRAVENOUS
  Filled 2014-04-18: qty 1

## 2014-04-18 MED ORDER — IBUPROFEN 800 MG PO TABS: 800.0000 mg | ORAL_TABLET | Freq: Three times a day (TID) | ORAL | Status: DC

## 2014-04-18 MED ORDER — ONDANSETRON HCL 4 MG/2ML IJ SOLN
4.0000 mg | Freq: Once | INTRAMUSCULAR | Status: AC
  Administered 2014-04-18: 4 mg via INTRAVENOUS
  Filled 2014-04-18: qty 2

## 2014-04-18 MED ORDER — METOCLOPRAMIDE HCL 5 MG/ML IJ SOLN
10.0000 mg | Freq: Once | INTRAMUSCULAR | Status: AC
  Administered 2014-04-18: 10 mg via INTRAVENOUS
  Filled 2014-04-18: qty 2

## 2014-04-18 MED ORDER — KETOROLAC TROMETHAMINE 30 MG/ML IJ SOLN
30.0000 mg | Freq: Once | INTRAMUSCULAR | Status: AC
  Administered 2014-04-18: 30 mg via INTRAVENOUS
  Filled 2014-04-18: qty 1

## 2014-04-18 MED ORDER — OXYCODONE-ACETAMINOPHEN 5-325 MG PO TABS
2.0000 | ORAL_TABLET | Freq: Once | ORAL | Status: AC
  Administered 2014-04-18: 2 via ORAL
  Filled 2014-04-18: qty 2

## 2014-04-18 NOTE — ED Provider Notes (Signed)
CSN: [id number]     Arrival date & time 04/18/14  1341 History   First MD Initiated Contact with Patient 04/18/14 1704     Chief Complaint  Patient presents with  . Headache     (Consider location/radiation/quality/duration/timing/severity/associated sxs/prior Treatment) HPI The patient presents with a complaint of 3 days of headache. She reports it did start fairly rapidly 3 days ago. It was predominantly on the temple area behind her eye. She reports that now it is mostly a frontal and top of her head headache, aching and throbbing in quality. Patient reports she also has light sensitivity. She for she has felt nauseated. If she tries to eat something, she reports that oftentimes she will throw that back up. She is not spontaneously vomiting without by mouth intake. She reports her vision has been slightly blurred. She denies any difficulty with her gait, no incoordination, no focal weakness of the extremities. Patient denies fever chills or neck stiffness. She reports she has not felt generally ill. She has not had nasal congestion sore throat cough. The patient reports she has had a severe headache like this in the past and it does get frequent headaches. The patient where she does have chronic back pain. There is no new neck stiffness or pain in association with this.  Family history: No history of aneurysm tumor or migraine.  History reviewed. No pertinent past medical history. Past Surgical History  Procedure Laterality Date  . Cesarean section    . Cholecystectomy    . Dilation and curettage of uterus N/A 03/05/2014    Procedure: DILATATION AND EVACUATION;  Surgeon: Mora Bellman, MD;  Location: [facility];  Service: Gynecology;  Laterality: N/A;   No family history on file. History  Substance Use Topics  . Smoking status: Current Every Day Smoker    Types: Cigarettes  . Smokeless tobacco: Never Used  . Alcohol Use: Yes     Comment: occasional   OB History   Grav Para Term  Preterm Abortions TAB SAB Ect Mult Living   5 4 4  0 0 0 0 0 0 4     Review of Systems 10 Systems reviewed and are negative for acute change except as noted in the HPI.    Allergies  Review of patient's allergies indicates no known allergies.  Home Medications   Prior to Admission medications   Medication Sig Start Date End Date Taking? Authorizing Provider  traMADol (ULTRAM) 50 MG tablet Take 50 mg by mouth every 6 (six) hours as needed for moderate pain.   Yes Historical Provider, MD  ibuprofen (ADVIL,MOTRIN) 800 MG tablet Take 1 tablet (800 mg total) by mouth 3 (three) times daily. 04/18/14   Charlesetta Shanks, MD  ondansetron (ZOFRAN ODT) 4 MG disintegrating tablet Take 1 tablet (4 mg total) by mouth every 4 (four) hours as needed for nausea or vomiting. 04/18/14   Charlesetta Shanks, MD  traMADol (ULTRAM) 50 MG tablet Take 1 tablet (50 mg total) by mouth every 6 (six) hours as needed. 04/18/14   Charlesetta Shanks, MD   BP 91/57  Pulse 47  Temp(Src) 97.3 F (36.3 C) (Oral)  Resp 18  SpO2 99%  LMP 12/03/2013 Physical Exam  Nursing note and vitals reviewed. Constitutional:  Awake, alert, nontoxic appearance with baseline speech for patient.  HENT:  Head: Atraumatic.  Mouth/Throat: No oropharyngeal exudate.  Eyes: EOM are normal. Pupils are equal, round, and reactive to light. Right eye exhibits no discharge. Left eye exhibits no discharge.  Neck: Neck supple.  Cardiovascular: Normal rate and regular rhythm.   No murmur heard. Pulmonary/Chest: Effort normal and breath sounds normal. No stridor. No respiratory distress. She has no wheezes. She has no rales. She exhibits no tenderness.  Abdominal: Soft. Bowel sounds are normal. She exhibits no mass. There is no tenderness. There is no rebound.  Musculoskeletal: She exhibits no tenderness.  Baseline ROM, moves extremities with no obvious new focal weakness.  Lymphadenopathy:    She has no cervical adenopathy.  Neurological:  Awake,  alert, cooperative and aware of situation; motor strength bilaterally; sensation normal to light touch bilaterally; peripheral visual fields full to confrontation; no facial asymmetry; tongue midline; major cranial nerves appear intact; no pronator drift, normal finger to nose bilaterally, baseline gait without new ataxia.  Skin: No rash noted.  Psychiatric: She has a normal mood and affect.   ENT: Normal TMs normal nares normal dentition normal posterior oropharynx. Neurological normal finger-nose examination normal heel shin examination ED Course  Procedures (including critical care time) Labs Review Labs Reviewed  URINALYSIS, ROUTINE W REFLEX MICROSCOPIC - Abnormal; Notable for the following:    Hgb urine dipstick MODERATE (*)    All other components within normal limits  COMPREHENSIVE METABOLIC PANEL - Abnormal; Notable for the following:    Total Bilirubin <0.2 (*)    All other components within normal limits  CBC WITH DIFFERENTIAL  TSH  URINE MICROSCOPIC-ADD ON  POC URINE PREG, ED    Imaging Review Ct Head Wo Contrast  04/18/2014   CLINICAL DATA:  Headache.  EXAM: CT HEAD WITHOUT CONTRAST  TECHNIQUE: Contiguous axial images were obtained from the base of the skull through the vertex without intravenous contrast.  COMPARISON:  None.  FINDINGS: Bony calvarium appears intact. No mass effect or midline shift is noted. Ventricular size is within normal limits. There is no evidence of mass lesion, hemorrhage or acute infarction.  IMPRESSION: Normal head CT.   Electronically Signed   By: Sabino Dick M.D.   On: 04/18/2014 18:15     EKG Interpretation None      MDM   Final diagnoses:  Nonintractable episodic headache, unspecified headache type   The patient presents on above diagnostic workup is negative at this point time. The patient does have a history of headaches but does not have a formal diagnosis of migraine although this does sound as though the patient has migraine. There  is no evidence of constitutional or infectious illness at this point in time. The headaches do, and go. It appears much less likely that this would be subarachnoid or other serious vascular etiology. The patient did get pain control in the emergency department and was able to take food without nausea or vomiting. At this point in time I have recommended follow up with neurology due to the persistence of headaches and frequency such that she might get improved treatment and further evaluation if needed. The patient is advised she is to return if there should be a change or worsening or symptoms or new or additional symptoms should develop.    Charlesetta Shanks, MD 04/18/14 2114

## 2014-04-18 NOTE — Discharge Instructions (Signed)

## 2014-04-18 NOTE — ED Notes (Signed)
Pt presents to department for evaluation of headache x3 days. Also states nausea/vomiting, blurred vision and photosensitivity. Pt is alert and oriented x4. No neurological deficits noted.

## 2014-04-18 NOTE — ED Notes (Signed)
Discharge instructions given along with prescriptions  Voiced understanding

## 2014-05-13 ENCOUNTER — Encounter (HOSPITAL_COMMUNITY): Payer: Self-pay | Admitting: Emergency Medicine

## 2014-05-13 ENCOUNTER — Emergency Department (HOSPITAL_COMMUNITY)
Admission: EM | Admit: 2014-05-13 | Discharge: 2014-05-13 | Disposition: A | Discharge status: Home / Self Care | Payer: Medicaid Other | Attending: Emergency Medicine | Admitting: Emergency Medicine

## 2014-05-13 DIAGNOSIS — Z79899 Other long term (current) drug therapy: Secondary | ICD-10-CM | POA: Diagnosis not present

## 2014-05-13 DIAGNOSIS — R112 Nausea with vomiting, unspecified: Secondary | ICD-10-CM | POA: Insufficient documentation

## 2014-05-13 DIAGNOSIS — Z720 Tobacco use: Secondary | ICD-10-CM | POA: Diagnosis not present

## 2014-05-13 DIAGNOSIS — G4485 Primary stabbing headache: Secondary | ICD-10-CM | POA: Diagnosis not present

## 2014-05-13 DIAGNOSIS — R51 Headache: Secondary | ICD-10-CM | POA: Diagnosis present

## 2014-05-13 MED ORDER — LORAZEPAM 2 MG/ML IJ SOLN
1.0000 mg | Freq: Once | INTRAMUSCULAR | Status: AC
  Administered 2014-05-13: 1 mg via INTRAVENOUS
  Filled 2014-05-13: qty 1

## 2014-05-13 MED ORDER — DIPHENHYDRAMINE HCL 50 MG/ML IJ SOLN
25.0000 mg | Freq: Once | INTRAMUSCULAR | Status: AC
  Administered 2014-05-13: 25 mg via INTRAVENOUS
  Filled 2014-05-13: qty 1

## 2014-05-13 MED ORDER — METOCLOPRAMIDE HCL 5 MG/ML IJ SOLN
10.0000 mg | Freq: Once | INTRAMUSCULAR | Status: AC
  Administered 2014-05-13: 10 mg via INTRAVENOUS
  Filled 2014-05-13: qty 2

## 2014-05-13 MED ORDER — SODIUM CHLORIDE 0.9 % IV SOLN
Freq: Once | INTRAVENOUS | Status: AC
  Administered 2014-05-13: 04:00:00 via INTRAVENOUS

## 2014-05-13 MED ORDER — HALOPERIDOL LACTATE 5 MG/ML IJ SOLN
5.0000 mg | Freq: Once | INTRAMUSCULAR | Status: AC
  Administered 2014-05-13: 5 mg via INTRAVENOUS
  Filled 2014-05-13: qty 1

## 2014-05-13 MED ORDER — KETOROLAC TROMETHAMINE 30 MG/ML IJ SOLN
30.0000 mg | Freq: Once | INTRAMUSCULAR | Status: AC
  Administered 2014-05-13: 30 mg via INTRAVENOUS
  Filled 2014-05-13: qty 1

## 2014-05-13 NOTE — ED Notes (Signed)
Pt here with headache that started yesterday afternoon.  Pt reports nausea and vomiting with headache.  Pt sts she has appointment with neurologist next month.  Pt has tried OTC medications at home with no relief.  No neurological deficits noted.

## 2014-05-13 NOTE — ED Notes (Signed)
Pt spouse verbalized understanding of discharge instructions.  Pt taken to the waiting room in a wheelchair to waiting family.  Pt was groggy but alert and able to ambulate and assist with ADLs.

## 2014-05-13 NOTE — ED Provider Notes (Signed)
CSN: [id number]     Arrival date & time 05/13/14  0304 History   First MD Initiated Contact with Patient 05/13/14 [phone]     Chief Complaint  Patient presents with  . Migraine     (Consider location/radiation/quality/duration/timing/severity/associated sxs/prior Treatment) HPI 43 year old female presents to the emergency apartment with complaint of headache.  She reports headache came on yesterday afternoon.  Headache is mainly over her right eye with some radiation backwards.  She reports that she has phono and photophobia.  She denies any weakness numbness fever chills.  She has had nausea and vomiting.  Patient reports she has history of similar headaches, seen for same last month.  Patient recently started seeing a pain clinic, and was started on Topamax on the 16th to help with this ongoing headaches.  She reports since starting the Topamax, she has felt more sluggish and fatigued.  She does not feel that the medication is working.  She has a followup appointment later today with her pain clinic.  She is taking tramadol without improvement in pain.  Patient has some issues with chronic low back pain for which she is seeing the pain clinic as well. History reviewed. No pertinent past medical history. Past Surgical History  Procedure Laterality Date  . Cesarean section    . Cholecystectomy    . Dilation and curettage of uterus N/A 03/05/2014    Procedure: DILATATION AND EVACUATION;  Surgeon: Mora Bellman, MD;  Location: [facility];  Service: Gynecology;  Laterality: N/A;   History reviewed. No pertinent family history. History  Substance Use Topics  . Smoking status: Current Every Day Smoker    Types: Cigarettes  . Smokeless tobacco: Never Used  . Alcohol Use: Yes     Comment: occasional   OB History   Grav Para Term Preterm Abortions TAB SAB Ect Mult Living   5 4 4  0 0 0 0 0 0 4     Review of Systems   See History of Present Illness; otherwise all other systems are reviewed and  negative  Allergies  Review of patient's allergies indicates no known allergies.  Home Medications   Prior to Admission medications   Medication Sig Start Date End Date Taking? Authorizing Provider  topiramate (TOPAMAX) 50 MG tablet Take 50 mg by mouth. 05/07/14 06/06/14 Yes Historical Provider, MD  traMADol (ULTRAM) 50 MG tablet Take 50 mg by mouth every 6 (six) hours as needed for moderate pain.   Yes Historical Provider, MD   BP 102/61  Pulse 53  Temp(Src) 98.2 F (36.8 C) (Oral)  Resp 19  SpO2 98%  LMP 05/09/2014  Breastfeeding? Unknown Physical Exam  Nursing note and vitals reviewed. Constitutional: She is oriented to person, place, and time. She appears well-developed and well-nourished. She appears distressed.  HENT:  Head: Normocephalic and atraumatic.  Right Ear: External ear normal.  Left Ear: External ear normal.  Nose: Nose normal.  Mouth/Throat: Oropharynx is clear and moist.  Eyes: Conjunctivae and EOM are normal. Pupils are equal, round, and reactive to light.  Neck: Normal range of motion. Neck supple. No JVD present. No tracheal deviation present. No thyromegaly present.  Cardiovascular: Normal rate, regular rhythm, normal heart sounds and intact distal pulses.  Exam reveals no gallop and no friction rub.   No murmur heard. Pulmonary/Chest: Effort normal and breath sounds normal. No stridor. No respiratory distress. She has no wheezes. She has no rales. She exhibits no tenderness.  Abdominal: Soft. Bowel sounds are normal. She exhibits  no distension and no mass. There is no tenderness. There is no rebound and no guarding.  Musculoskeletal: Normal range of motion. She exhibits no edema and no tenderness.  Lymphadenopathy:    She has no cervical adenopathy.  Neurological: She is alert and oriented to person, place, and time. She displays normal reflexes. No cranial nerve deficit. She exhibits normal muscle tone. Coordination normal.  Skin: Skin is warm and dry.  No rash noted. No erythema. No pallor.  Psychiatric: She has a normal mood and affect. Her behavior is normal. Judgment and thought content normal.    ED Course  Procedures (including critical care time) Labs Review Labs Reviewed - No data to display  Imaging Review No results found.   EKG Interpretation None      MDM   Final diagnoses:  Primary stabbing headache    43 year old female with severe headache, similar to prior.  Doubt serious etiology such as subarachnoid hemorrhage, intracranial hypertension, venous thrombosis.  Patient had a thorough workup on her last visit with CT scan and labs.  Patient's neuro exam is unremarkable.  We'll try to get pain under control.  Will start with Reglan Benadryl and Ativan.  Patient to receive IV fluids.  5:42 AM Patient reports only minimal improvement in headache with initial headache cocktail.  She reports that her body is more relaxed, but headache is unresolved.  I am reluctant to use narcotics for fear rebound as she is oriented taking Ultram.  We'll plan for Haldol, Toradol, and repeat Ativan.  EKG without prolonged QTC.    6:22 AM Pt feeling much better.  She has an appointment today at 1 PM at the pain clinic.  Will discharge her now.  Patient instructed in the home and rest, and followup today to discuss the side effects that she is having with Topamax  Kalman Drape, MD 05/13/14 [phone]

## 2014-05-13 NOTE — ED Notes (Signed)
Dr. Sharol Given advised that patients pain level unchanged from medications administered, rating 10/10 pain.  See MAR for new orders.

## 2014-05-13 NOTE — Discharge Instructions (Signed)

## 2014-05-24 ENCOUNTER — Encounter (HOSPITAL_COMMUNITY): Payer: Self-pay | Admitting: Emergency Medicine

## 2014-05-28 ENCOUNTER — Encounter: Payer: Self-pay | Admitting: Neurology

## 2014-05-28 ENCOUNTER — Ambulatory Visit (INDEPENDENT_AMBULATORY_CARE_PROVIDER_SITE_OTHER): Payer: Medicaid Other | Admitting: Neurology

## 2014-05-28 VITALS — BP 140/82 | HR 88 | Resp 16 | Ht 70.0 in | Wt 235.7 lb

## 2014-05-28 DIAGNOSIS — R51 Headache: Secondary | ICD-10-CM

## 2014-05-28 DIAGNOSIS — F329 Major depressive disorder, single episode, unspecified: Secondary | ICD-10-CM

## 2014-05-28 DIAGNOSIS — F32A Depression, unspecified: Secondary | ICD-10-CM

## 2014-05-28 DIAGNOSIS — G4440 Drug-induced headache, not elsewhere classified, not intractable: Secondary | ICD-10-CM

## 2014-05-28 DIAGNOSIS — G4441 Drug-induced headache, not elsewhere classified, intractable: Secondary | ICD-10-CM

## 2014-05-28 DIAGNOSIS — G43019 Migraine without aura, intractable, without status migrainosus: Secondary | ICD-10-CM

## 2014-05-28 DIAGNOSIS — R519 Headache, unspecified: Secondary | ICD-10-CM

## 2014-05-28 MED ORDER — SUMATRIPTAN SUCCINATE 100 MG PO TABS: ORAL_TABLET | ORAL | Status: DC

## 2014-05-28 MED ORDER — TOPIRAMATE 50 MG PO TABS: 100.0000 mg | ORAL_TABLET | Freq: Every day | ORAL | Status: DC
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
## 2014-05-28 NOTE — Patient Instructions (Addendum)
1.  Continue topamax 50mg .  Take 2 pills (100mg ) at bedtime.  Possible side effects include: impaired thinking, sedation, paresthesias (numbness and tingling) and weight loss.  It may cause dehydration and there is a small risk for kidney stones, so make sure to stay hydrated with water during the day.  There is also a very small risk for glaucoma, so if you notice any change in your vision while taking this medication, see an ophthalmologist.  There is also a very small risk of possible suicidal ideation, as it the case with all antiepileptic medications.  2.  At earliest onset of migraine, take sumatriptan 100mg .  May repeat once in 2 hours if headache persists or recurs.  Do not exceed 2 pills in 24 hours.  If this is ineffective, then the next time you have a migraine, take the first dose of sumatriptan with naproxen 500mg . 3.  Stop Excedrin, Tylenol and ibuprofen.  You should not be taking any pain relievers more than 2 days out of the week. 4.  Read and follow sleeping tips 5.  Refer to behavioral health. 6.  I would consider antidepressant therapy as well.  Suitable antidepressants, which also treat migraine, include Effexor, Zoloft, Elavil and Pamelor. 7.  We will get an MRI of the brain. [facility] 06/14/14 1:45 pm  8.  Follow up in 4 weeks.

## 2014-06-01 ENCOUNTER — Other Ambulatory Visit: Payer: Self-pay | Admitting: *Deleted

## 2014-06-01 DIAGNOSIS — R519 Headache, unspecified: Secondary | ICD-10-CM

## 2014-06-01 DIAGNOSIS — R51 Headache: Principal | ICD-10-CM

## 2014-06-14 ENCOUNTER — Ambulatory Visit (HOSPITAL_COMMUNITY): Admission: RE | Admit: 2014-06-14 | Payer: Medicaid Other | Source: Ambulatory Visit

## 2014-06-28 ENCOUNTER — Encounter: Payer: Self-pay | Admitting: Neurology

## 2014-06-28 ENCOUNTER — Ambulatory Visit (INDEPENDENT_AMBULATORY_CARE_PROVIDER_SITE_OTHER): Payer: Medicaid Other | Admitting: Neurology

## 2014-06-28 VITALS — BP 132/76 | HR 78 | Temp 98.4°F | Resp 16 | Ht 68.0 in | Wt 230.5 lb

## 2014-06-28 DIAGNOSIS — G43009 Migraine without aura, not intractable, without status migrainosus: Secondary | ICD-10-CM

## 2014-06-28 NOTE — Patient Instructions (Signed)
1.  For the topiramate 50mg  tablets, take 2 in the morning and 2 at night. 2.  When you get a migraine, take the sumatriptan 100mg  at earliest onset of headache.  You may repeat the dose once in 2 hours if needed.  You may also take the first dose of sumatriptan with naproxen 500mg  (which is over the counter) 3.  We will look into the MRI 4.  We will refer you to behavioral health to help with stress and depression, which is a major cause of migraine 5.  Please follow the sleeping exercises on the sleep hygiene sheet 6.  Follow up in 2 months.
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
## 2014-06-30 ENCOUNTER — Ambulatory Visit (HOSPITAL_COMMUNITY)
Admission: RE | Admit: 2014-06-30 | Discharge: 2014-06-30 | Disposition: A | Discharge status: Home / Self Care | Payer: Medicaid Other | Source: Ambulatory Visit | Attending: Neurology | Admitting: Neurology

## 2014-06-30 DIAGNOSIS — G4440 Drug-induced headache, not elsewhere classified, not intractable: Secondary | ICD-10-CM

## 2014-06-30 DIAGNOSIS — G43019 Migraine without aura, intractable, without status migrainosus: Secondary | ICD-10-CM | POA: Diagnosis not present

## 2014-06-30 MED ORDER — GADOBENATE DIMEGLUMINE 529 MG/ML IV SOLN
20.0000 mL | Freq: Once | INTRAVENOUS | Status: AC | PRN
  Administered 2014-06-30: 20 mL via INTRAVENOUS

## 2014-07-01 ENCOUNTER — Telehealth: Payer: Self-pay | Admitting: *Deleted

## 2014-07-01 ENCOUNTER — Telehealth: Payer: Self-pay | Admitting: Neurology

## 2014-07-01 NOTE — Telephone Encounter (Signed)
-----   Message from Dudley Major, DO sent at 07/01/2014  6:25 AM EST ----- Mri normal ----- Message -----    From: Rad Results In Interface    Sent: 06/30/2014   6:17 PM      To: Dudley Major, DO

## 2014-07-01 NOTE — Telephone Encounter (Signed)
to let pateint know she will need to go to [facility] for behavioral health due to insurance

## 2014-07-01 NOTE — Telephone Encounter (Signed)
Left message of normal MRI and ask pateint to call office she will need to go to [facility] for behavorial health

## 2014-07-01 NOTE — Telephone Encounter (Signed)
Pt called/returning your call at 11:29AM. C/B [phone]

## 2014-07-02 NOTE — Telephone Encounter (Signed)
Left message on machine to call back for results

## 2014-07-02 NOTE — Telephone Encounter (Signed)
Pt called wanting to speak to a nurse regarding her MIR results. C/B [phone]

## 2014-07-07 ENCOUNTER — Telehealth: Payer: Self-pay | Admitting: Neurology

## 2014-07-07 NOTE — Telephone Encounter (Signed)
Pt called requesting a refill for TOPIRAMATE and pt would also like to know the results of her MRI she had done on last week.  Pharmacy: [facility] [address]  C/b  [phone]

## 2014-07-07 NOTE — Telephone Encounter (Signed)
Patient is aware of normal MRI and medication [facility] [address] also [facility] Sessions has been contacted

## 2014-07-12 ENCOUNTER — Telehealth: Payer: Self-pay | Admitting: *Deleted

## 2014-07-12 ENCOUNTER — Other Ambulatory Visit: Payer: Self-pay | Admitting: *Deleted

## 2014-07-12 MED ORDER — TOPIRAMATE 50 MG PO TABS: 100.0000 mg | ORAL_TABLET | Freq: Every day | ORAL | Status: DC

## 2014-07-12 NOTE — Telephone Encounter (Signed)
Topamax 50 mg #60 2 HS

## 2014-07-21 ENCOUNTER — Telehealth: Payer: Self-pay | Admitting: *Deleted

## 2014-07-21 NOTE — Telephone Encounter (Signed)
She can come in for headache cocktail (Toradol 60mg Lenard Galloway 25mg /Reglan 10mg ) or we can prescribe her prednisone taper (Take 6tabs x1day, then 5tabs x1day, then 4tabs x1day, then 3tabs x1day, then 2tabs x1day, then 1tab x1day, then STOP).

## 2014-07-21 NOTE — Telephone Encounter (Signed)
Patient has had headache for 2 days is taking Topamax 100 mg bid and imitrex with naproxen 500 mg tablet at onset of headache . Please advise

## 2014-07-21 NOTE — Telephone Encounter (Signed)
Patient states that she has been taking her medication but has had a headache for 2 days please advise Call back number [phone]

## 2014-07-22 ENCOUNTER — Telehealth: Payer: Self-pay | Admitting: Neurology

## 2014-07-22 ENCOUNTER — Telehealth: Payer: Self-pay | Admitting: *Deleted

## 2014-07-22 NOTE — Telephone Encounter (Signed)
Pt called/returning your call at 9:12AM. C/b [phone]

## 2014-07-22 NOTE — Telephone Encounter (Signed)
For patient to return call to office regarding headache

## 2014-07-22 NOTE — Telephone Encounter (Signed)
prednisone taper 10 mg  (Take 6tabs x1day, then 5tabs x1day, then 4tabs x1day, then 3tabs x1day, then 2tabs x1day, then 1tab x1day, then STOP).

## 2014-07-30 ENCOUNTER — Other Ambulatory Visit: Payer: Self-pay | Admitting: *Deleted

## 2014-07-30 MED ORDER — TOPIRAMATE 50 MG PO TABS: 50.0000 mg | ORAL_TABLET | Freq: Two times a day (BID) | ORAL | Status: DC

## 2014-07-30 MED ORDER — TOPIRAMATE 50 MG PO TABS: 100.0000 mg | ORAL_TABLET | Freq: Every day | ORAL | Status: DC

## 2014-08-02 ENCOUNTER — Telehealth: Payer: Self-pay | Admitting: Neurology

## 2014-08-02 ENCOUNTER — Telehealth: Payer: Self-pay | Admitting: *Deleted

## 2014-08-02 NOTE — Telephone Encounter (Signed)
Left message for patient to return call.

## 2014-08-02 NOTE — Telephone Encounter (Signed)
No answer  On return call

## 2014-08-02 NOTE — Telephone Encounter (Signed)
Pt is returning your call please.

## 2014-08-02 NOTE — Telephone Encounter (Signed)
Pt needs to speak to you about medication please call [phone] please call ASAP per patient

## 2014-08-30 ENCOUNTER — Telehealth: Payer: Self-pay | Admitting: Neurology

## 2014-08-30 NOTE — Telephone Encounter (Signed)
Pt had something come up and could not come in. Will call later to r/s.

## 2014-08-31 ENCOUNTER — Ambulatory Visit: Payer: Medicaid Other | Admitting: Neurology

## 2014-10-04 ENCOUNTER — Encounter (HOSPITAL_COMMUNITY): Payer: Self-pay | Admitting: *Deleted

## 2014-10-04 ENCOUNTER — Inpatient Hospital Stay (HOSPITAL_COMMUNITY)
Admission: AD | Admit: 2014-10-04 | Discharge: 2014-10-05 | Disposition: A | Discharge status: Home / Self Care | Payer: Medicaid Other | Source: Ambulatory Visit | Attending: Obstetrics and Gynecology | Admitting: Obstetrics and Gynecology

## 2014-10-04 DIAGNOSIS — N921 Excessive and frequent menstruation with irregular cycle: Secondary | ICD-10-CM | POA: Insufficient documentation

## 2014-10-04 DIAGNOSIS — F17210 Nicotine dependence, cigarettes, uncomplicated: Secondary | ICD-10-CM | POA: Diagnosis not present

## 2014-10-04 DIAGNOSIS — N939 Abnormal uterine and vaginal bleeding, unspecified: Secondary | ICD-10-CM | POA: Diagnosis present

## 2014-10-04 DIAGNOSIS — R109 Unspecified abdominal pain: Secondary | ICD-10-CM | POA: Insufficient documentation

## 2014-10-04 LAB — CBC
HCT: 38.2 % (ref 36.0–46.0)
HEMOGLOBIN: 12.5 g/dL (ref 12.0–15.0)
MCH: 30.9 pg (ref 26.0–34.0)
MCHC: 32.7 g/dL (ref 30.0–36.0)
MCV: 94.3 fL (ref 78.0–100.0)
Platelets: 274 10*3/uL (ref 150–400)
RBC: 4.05 MIL/uL (ref 3.87–5.11)
RDW: 13.7 % (ref 11.5–15.5)
WBC: 9.2 10*3/uL (ref 4.0–10.5)

## 2014-10-04 LAB — URINALYSIS, ROUTINE W REFLEX MICROSCOPIC
Bilirubin Urine: NEGATIVE
GLUCOSE, UA: NEGATIVE mg/dL
Ketones, ur: NEGATIVE mg/dL
LEUKOCYTES UA: NEGATIVE
Nitrite: NEGATIVE
Protein, ur: 30 mg/dL — AB
Specific Gravity, Urine: 1.025 (ref 1.005–1.030)
Urobilinogen, UA: 1 mg/dL (ref 0.0–1.0)
pH: 6.5 (ref 5.0–8.0)

## 2014-10-04 LAB — URINE MICROSCOPIC-ADD ON

## 2014-10-04 LAB — POCT PREGNANCY, URINE: Preg Test, Ur: NEGATIVE

## 2014-10-04 LAB — WET PREP, GENITAL
Trich, Wet Prep: NONE SEEN
YEAST WET PREP: NONE SEEN

## 2014-10-04 MED ORDER — MEDROXYPROGESTERONE ACETATE 150 MG/ML IM SUSP
150.0000 mg | Freq: Once | INTRAMUSCULAR | Status: AC
  Administered 2014-10-05: 150 mg via INTRAMUSCULAR
  Filled 2014-10-04: qty 1

## 2014-10-04 MED ORDER — KETOROLAC TROMETHAMINE 60 MG/2ML IM SOLN
60.0000 mg | Freq: Once | INTRAMUSCULAR | Status: AC
  Administered 2014-10-04: 60 mg via INTRAMUSCULAR
  Filled 2014-10-04: qty 2

## 2014-10-04 NOTE — MAU Note (Signed)
PT SAYS SHE HAD UPT  AT  [facility]    ON Friday-  NEG  UPT  .   LMP- 2-17. ALL NL-  X5 DAYS.      THEN STARTED  BLEEDING AGAIN  ON 3-1-  THOUGHT  ANOTHER  PERIOD-   AND HAS CONTINUED  TO BLEED      QDAY.   TODAY - HAS  BLOOD  CLOTS -    PAD ON IN TRIAGE-      SMALL AMT   LIGHT  RED.     CRAMPING  STARTED ON   Friday-  TRAMADOL-  TODAY  12NOON.      NO DR  HERE-   HAD ALL  BABIES  IN [place].

## 2014-10-04 NOTE — MAU Provider Note (Signed)
History     CSN: [id number]  Arrival date and time: 10/04/14 2149   First Provider Initiated Contact with Patient 10/04/14 2255      No chief complaint on file.  HPI Comments: Connie Bryan is a 44 y.o. [id number] who presents today with vaginal bleeding x 2 weeks. She states that she has been changing her pad about 10x per day. She has also had lower abdominal cramping. She rates her pain 8/10. She has tramadol for her back pain, and she has been taking that for the cramping. That brings her pain to a 4/10. She states that it feels like period cramps, but it is worse.   She was seen by her PCP 3 days ago, and had a pap smear. She states that she has not heard those results yet. She was given a rx for OCPs, but states that she will not take birth control pills because of her migraines. So, she has not started taking the OCPs at this time for fear of side effects and worsening migraines.   She states that her cycles are normal and regular. She denies any bleeding episode like this in the past. Denies N/V/D or vaginal discharge. Denies hx of fibroids, ovarian cysts or thyroid problems.   Vaginal Bleeding Associated symptoms include abdominal pain. Pertinent negatives include no constipation, diarrhea, dysuria, frequency, nausea, urgency or vomiting.      Past Medical History  Diagnosis Date  . Headache     Past Surgical History  Procedure Laterality Date  . Cesarean section    . Cholecystectomy    . Dilation and curettage of uterus N/A 03/05/2014    Procedure: DILATATION AND EVACUATION;  Surgeon: Mora Bellman, MD;  Location: [facility];  Service: Gynecology;  Laterality: N/A;    Family History  Problem Relation Age of Onset  . Cancer Father     prostate  . Cancer Maternal Grandmother     unknown    History  Substance Use Topics  . Smoking status: Current Every Day Smoker -- 0.25 packs/day    Types: Cigarettes  . Smokeless tobacco: Never Used  . Alcohol Use: No     Comment:  occasional    Allergies: No Known Allergies  Prescriptions prior to admission  Medication Sig Dispense Refill Last Dose  . SUMAtriptan (IMITREX) 100 MG tablet Take 1tab at earliest onset of headache.  May repeat x1 in 2 hours if headache persists or recurs.  Do not exceed 2 tabs in 24 hrs. 9 tablet 2 Taking  . topiramate (TOPAMAX) 50 MG tablet Take 1 tablet (50 mg total) by mouth 2 (two) times daily. 60 tablet 3   . topiramate (TOPAMAX) 50 MG tablet Take 2 tablets (100 mg total) by mouth at bedtime. 60 tablet 0   . traMADol (ULTRAM) 50 MG tablet Take 50 mg by mouth every 6 (six) hours as needed for moderate pain.   Taking    Review of Systems  Respiratory: Negative for shortness of breath.   Gastrointestinal: Positive for abdominal pain. Negative for nausea, vomiting, diarrhea and constipation.  Genitourinary: Positive for vaginal bleeding. Negative for dysuria, urgency and frequency.  Neurological: Negative for dizziness.  All other systems reviewed and are negative.  Physical Exam   Blood pressure 103/67, pulse 60, temperature 99 F (37.2 C), temperature source Oral, resp. rate 18, height 5\' 8"  (1.727 m), weight 104.781 kg (231 lb), last menstrual period 09/08/2013.  Physical Exam  Nursing note and vitals reviewed. Constitutional: She is oriented  to person, place, and time. She appears well-developed and well-nourished. No distress.  Cardiovascular: Normal rate and normal heart sounds.   Respiratory: Effort normal and breath sounds normal.  GI: Soft. Bowel sounds are normal. She exhibits no distension and no mass. There is tenderness (in the lower abdomen over the pubic bone. ). There is no rebound and no guarding.  Genitourinary:   External: no lesion Vagina: small amount of red blood, no clots  Cervix: pink, smooth, no CMT Uterus: slightly enlarged  Adnexa: NT, no masses    Neurological: She is alert and oriented to person, place, and time.  Skin: Skin is warm and dry.   Psychiatric: She has a normal mood and affect.    MAU Course  Procedures  Results for orders placed or performed during the hospital encounter of 10/04/14 (from the past 24 hour(s))  Urinalysis, Routine w reflex microscopic     Status: Abnormal   Collection Time: 10/04/14 10:29 PM  Result Value Ref Range   Color, Urine RED (A) YELLOW   APPearance CLOUDY (A) CLEAR   Specific Gravity, Urine 1.025 1.005 - 1.030   pH 6.5 5.0 - 8.0   Glucose, UA NEGATIVE NEGATIVE mg/dL   Hgb urine dipstick LARGE (A) NEGATIVE   Bilirubin Urine NEGATIVE NEGATIVE   Ketones, ur NEGATIVE NEGATIVE mg/dL   Protein, ur 30 (A) NEGATIVE mg/dL   Urobilinogen, UA 1.0 0.0 - 1.0 mg/dL   Nitrite NEGATIVE NEGATIVE   Leukocytes, UA NEGATIVE NEGATIVE  Urine microscopic-add on     Status: Abnormal   Collection Time: 10/04/14 10:29 PM  Result Value Ref Range   Squamous Epithelial / LPF FEW (A) RARE   WBC, UA 0-2 <3 WBC/hpf   RBC / HPF TOO NUMEROUS TO COUNT <3 RBC/hpf   Bacteria, UA RARE RARE  Pregnancy, urine POC     Status: None   Collection Time: 10/04/14 10:45 PM  Result Value Ref Range   Preg Test, Ur NEGATIVE NEGATIVE  CBC     Status: None   Collection Time: 10/04/14 11:00 PM  Result Value Ref Range   WBC 9.2 4.0 - 10.5 K/uL   RBC 4.05 3.87 - 5.11 MIL/uL   Hemoglobin 12.5 12.0 - 15.0 g/dL   HCT 38.2 36.0 - 46.0 %   MCV 94.3 78.0 - 100.0 fL   MCH 30.9 26.0 - 34.0 pg   MCHC 32.7 30.0 - 36.0 g/dL   RDW 13.7 11.5 - 15.5 %   Platelets 274 150 - 400 K/uL    Patient has had toradol and depo-provera while here. R/B/A to depo reviewed with the patient. She is interested in a LARC at this time, and would like to proceed with depo while waiting for LARC.   Assessment and Plan   1. Menometrorrhagia    DC home Depo here RX toradol PRN FU with OBGYN for Mirena placement Pelvic US outpatient   Follow-up Information    Follow up with Sonda Primes, MD.   Specialty:  Obstetrics and Gynecology   Why:  As  scheduled   Contact information:   [place] [address] [phone]        Mathis Bud 10/04/2014, 11:11 PM

## 2014-10-05 DIAGNOSIS — N921 Excessive and frequent menstruation with irregular cycle: Secondary | ICD-10-CM

## 2014-10-05 MED ORDER — KETOROLAC TROMETHAMINE 10 MG PO TABS: 10.0000 mg | ORAL_TABLET | Freq: Four times a day (QID) | ORAL | Status: DC | PRN

## 2014-10-05 NOTE — Discharge Instructions (Signed)
Menorrhagia  Menorrhagia is the medical term for when your menstrual periods are heavy or last longer than usual. With menorrhagia, every period you have may cause enough blood loss and cramping that you are unable to maintain your usual activities.  CAUSES   In some cases, the cause of heavy periods is unknown, but a number of conditions may cause menorrhagia. Common causes include:   A problem with the hormone-producing thyroid gland (hypothyroid).   Noncancerous growths in the uterus (polyps or fibroids).   An imbalance of the estrogen and progesterone hormones.   One of your ovaries not releasing an egg during one or more months.   Side effects of having an intrauterine device (IUD).   Side effects of some medicines, such as anti-inflammatory medicines or blood thinners.   A bleeding disorder that stops your blood from clotting normally.  SIGNS AND SYMPTOMS   During a normal period, bleeding lasts between 4 and 8 days. Signs that your periods are too heavy include:   You routinely have to change your pad or tampon every 1 or 2 hours because it is completely soaked.   You pass blood clots larger than 1 inch (2.5 cm) in size.   You have bleeding for more than 7 days.   You need to use pads and tampons at the same time because of heavy bleeding.   You need to wake up to change your pads or tampons during the night.   You have symptoms of anemia, such as tiredness, fatigue, or shortness of breath.  DIAGNOSIS   Your health care provider will perform a physical exam and ask you questions about your symptoms and menstrual history. Other tests may be ordered based on what the health care provider finds during the exam. These tests can include:   Blood tests. Blood tests are used to check if you are pregnant or have hormonal changes, a bleeding or thyroid disorder, low iron levels (anemia), or other problems.   Endometrial biopsy. Your health care provider takes a sample of tissue from the inside of your  uterus to be examined under a microscope.   Pelvic ultrasound. This test uses sound waves to make a picture of your uterus, ovaries, and vagina. The pictures can show if you have fibroids or other growths.   Hysteroscopy. For this test, your health care provider will use a small telescope to look inside your uterus.  Based on the results of your initial tests, your health care provider may recommend further testing.  TREATMENT   Treatment may not be needed. If it is needed, your health care provider may recommend treatment with one or more medicines first. If these do not reduce bleeding enough, a surgical treatment might be an option. The best treatment for you will depend on:    Whether you need to prevent pregnancy.   Your desire to have children in the future.   The cause and severity of your bleeding.   Your opinion and personal preference.   Medicines for menorrhagia may include:   Birth control methods that use hormones. These include the pill, skin patch, vaginal ring, shots that you get every 3 months, hormonal IUD, and implant. These treatments reduce bleeding during your menstrual period.   Medicines that thicken blood and slow bleeding.   Medicines that reduce swelling, such as ibuprofen.   Medicines that contain a synthetic hormone called progestin.    Medicines that make the ovaries stop working for a short time.     the lining of your uterus to reduce menstrual bleeding.  Operative hysteroscopy. This procedure uses a tiny tube with a light (hysteroscope) to view your uterine cavity and can help in the surgical removal of a polyp that may be causing heavy periods.  Endometrial ablation. Through various techniques, your health care  provider permanently destroys the entire lining of your uterus (endometrium). After endometrial ablation, most women have little or no menstrual flow. Endometrial ablation reduces your ability to become pregnant.  Endometrial resection. This surgical procedure uses an electrosurgical wire loop to remove the lining of the uterus. This procedure also reduces your ability to become pregnant.  Hysterectomy. Surgical removal of the uterus and cervix is a permanent procedure that stops menstrual periods. Pregnancy is not possible after a hysterectomy. This procedure requires anesthesia and hospitalization. HOME CARE INSTRUCTIONS   Only take over-the-counter or prescription medicines as directed by your health care provider. Take prescribed medicines exactly as directed. Do not change or switch medicines without consulting your health care provider.  Take any prescribed iron pills exactly as directed by your health care provider. Long-term heavy bleeding may result in low iron levels. Iron pills help replace the iron your body lost from heavy bleeding. Iron may cause constipation. If this becomes a problem, increase the bran, fruits, and roughage in your diet.  Do not take aspirin or medicines that contain aspirin 1 week before or during your menstrual period. Aspirin may make the bleeding worse.  If you need to change your sanitary pad or tampon more than once every 2 hours, stay in bed and rest as much as possible until the bleeding stops.  Eat well-balanced meals. Eat foods high in iron. Examples are leafy green vegetables, meat, liver, eggs, and whole grain breads and cereals. Do not try to lose weight until the abnormal bleeding has stopped and your blood iron level is back to normal. SEEK MEDICAL CARE IF:   You soak through a pad or tampon every 1 or 2 hours, and this happens every time you have a period.  You need to use pads and tampons at the same time because you are bleeding so much.  You  need to change your pad or tampon during the night.  You have a period that lasts for more than 8 days.  You pass clots bigger than 1 inch wide.  You have irregular periods that happen more or less often than once a month.  You feel dizzy or faint.  You feel very weak or tired.  You feel short of breath or feel your heart is beating too fast when you exercise.  You have nausea and vomiting or diarrhea while you are taking your medicine.  You have any problems that may be related to the medicine you are taking. SEEK IMMEDIATE MEDICAL CARE IF:   You soak through 4 or more pads or tampons in 2 hours.  You have any bleeding while you are pregnant. MAKE SURE YOU:   Understand these instructions.  Will watch your condition.  Will get help right away if you are not doing well or get worse. Document Released: 07/09/2005 Document Revised: 07/14/2013 Document Reviewed: 12/28/2012 [facility] Patient Information 2015 [place], [place]. This information is not intended to replace advice given to you by your health care provider. Make sure you discuss any questions you have with your health care provider. Perimenopause Perimenopause is the time when your body begins to move into the menopause (no menstrual period for 12 straight months).  It is a natural process. Perimenopause can begin 2-8 years before the menopause and usually lasts for 1 year after the menopause. During this time, your ovaries may or may not produce an egg. The ovaries vary in their production of estrogen and progesterone hormones each month. This can cause irregular menstrual periods, difficulty getting pregnant, vaginal bleeding between periods, and uncomfortable symptoms. CAUSES  Irregular production of the ovarian hormones, estrogen and progesterone, and not ovulating every month.  Other causes include:  Tumor of the pituitary gland in the brain.  Medical disease that affects the ovaries.  Radiation  treatment.  Chemotherapy.  Unknown causes.  Heavy smoking and excessive alcohol intake can bring on perimenopause sooner. SIGNS AND SYMPTOMS   Hot flashes.  Night sweats.  Irregular menstrual periods.  Decreased sex drive.  Vaginal dryness.  Headaches.  Mood swings.  Depression.  Memory problems.  Irritability.  Tiredness.  Weight gain.  Trouble getting pregnant.  The beginning of losing bone cells (osteoporosis).  The beginning of hardening of the arteries (atherosclerosis). DIAGNOSIS  Your health care provider will make a diagnosis by analyzing your age, menstrual history, and symptoms. He or she will do a physical exam and note any changes in your body, especially your female organs. Female hormone tests may or may not be helpful depending on the amount of female hormones you produce and when you produce them. However, other hormone tests may be helpful to rule out other problems. TREATMENT  In some cases, no treatment is needed. The decision on whether treatment is necessary during the perimenopause should be made by you and your health care provider based on how the symptoms are affecting you and your lifestyle. Various treatments are available, such as:  Treating individual symptoms with a specific medicine for that symptom.  Herbal medicines that can help specific symptoms.  Counseling.  Group therapy. HOME CARE INSTRUCTIONS   Keep track of your menstrual periods (when they occur, how heavy they are, how long between periods, and how long they last) as well as your symptoms and when they started.  Only take over-the-counter or prescription medicines as directed by your health care provider.  Sleep and rest.  Exercise.  Eat a diet that contains calcium (good for your bones) and soy (acts like the estrogen hormone).  Do not smoke.  Avoid alcoholic beverages.  Take vitamin supplements as recommended by your health care provider. Taking vitamin E  may help in certain cases.  Take calcium and vitamin D supplements to help prevent bone loss.  Group therapy is sometimes helpful.  Acupuncture may help in some cases. SEEK MEDICAL CARE IF:   You have questions about any symptoms you are having.  You need a referral to a specialist (gynecologist, psychiatrist, or psychologist). SEEK IMMEDIATE MEDICAL CARE IF:   You have vaginal bleeding.  Your period lasts longer than 8 days.  Your periods are recurring sooner than 21 days.  You have bleeding after intercourse.  You have severe depression.  You have pain when you urinate.  You have severe headaches.  You have vision problems. Document Released: 08/16/2004 Document Revised: 04/29/2013 Document Reviewed: 02/05/2013 [facility] Patient Information 2015 [place], [place]. This information is not intended to replace advice given to you by your health care provider. Make sure you discuss any questions you have with your health care provider.

## 2014-10-06 LAB — HIV ANTIBODY (ROUTINE TESTING W REFLEX): HIV Screen 4th Generation wRfx: NONREACTIVE

## 2014-10-06 LAB — GC/CHLAMYDIA PROBE AMP (~~LOC~~) NOT AT ARMC
Chlamydia: NEGATIVE
Neisseria Gonorrhea: NEGATIVE

## 2014-10-08 ENCOUNTER — Ambulatory Visit (HOSPITAL_COMMUNITY)
Admission: RE | Admit: 2014-10-08 | Discharge: 2014-10-08 | Disposition: A | Discharge status: Home / Self Care | Payer: Medicaid Other | Source: Ambulatory Visit | Attending: Advanced Practice Midwife | Admitting: Advanced Practice Midwife

## 2014-10-08 ENCOUNTER — Other Ambulatory Visit (HOSPITAL_COMMUNITY): Payer: Self-pay | Admitting: Advanced Practice Midwife

## 2014-10-08 DIAGNOSIS — N854 Malposition of uterus: Secondary | ICD-10-CM | POA: Diagnosis not present

## 2014-10-08 DIAGNOSIS — D250 Submucous leiomyoma of uterus: Secondary | ICD-10-CM | POA: Insufficient documentation

## 2014-10-08 DIAGNOSIS — N921 Excessive and frequent menstruation with irregular cycle: Secondary | ICD-10-CM

## 2014-10-11 ENCOUNTER — Telehealth: Payer: Self-pay | Admitting: *Deleted

## 2014-10-11 NOTE — Telephone Encounter (Signed)
-----   Message from Jill Poling, [place] [employer] sent at 10/08/2014  2:05 PM EDT ----- Regarding: U/S Reults We have just completed a pelvic outpatient ultrasound scheduled for a patient who was seen in [facility].  Please call the patient with the results.

## 2014-10-11 NOTE — Telephone Encounter (Signed)
Attempted to contact patient, no answer, left message for patient to contact the clinic for results, pt may leave message that indicates we can leave the results on secure voicemail.

## 2014-10-11 NOTE — Telephone Encounter (Signed)
Patient called back requesting ultrasound results. Called patient and informed her of fibroid in uterus but otherwise normal ultrasound. Asked patient is she had a gyn that she was following up with. Patient verbalized understanding and states yes she sees them on 3/27. Told patient they can further review her ultrasound with her then and answer any questions she may have. Patient verbalized understanding and had no other questions

## 2015-05-09 ENCOUNTER — Ambulatory Visit (INDEPENDENT_AMBULATORY_CARE_PROVIDER_SITE_OTHER): Payer: Self-pay | Admitting: Physician Assistant

## 2015-05-09 VITALS — BP 102/64 | HR 83 | Temp 98.7°F | Resp 16 | Ht 68.0 in | Wt 237.0 lb

## 2015-05-09 DIAGNOSIS — N912 Amenorrhea, unspecified: Secondary | ICD-10-CM

## 2015-05-09 LAB — POCT URINE PREGNANCY: Preg Test, Ur: NEGATIVE

## 2015-05-09 NOTE — Progress Notes (Signed)
   05/09/2015 at 1:27 PM  Connie Bryan / DOB: 13-Apr-1971 / MRN: [id number]  The patient has Lumbar spondylosis-with chronic pain; Menorrhagia; H/O cesarean section X 3; and Abortion, missed on her problem list.  SUBJECTIVE  Connie Bryan is a 44 y.o. female who complains of testing positive for pregnancy this morning. She also complains of nausea and breast tenderness at this time.  She had her IUD removed roughly 4 months ago and since that time has been having very irregular periods. States that she wants to go back to her OB/GYN for other contraceptive options. No changes in appetite and no belly pain.    She  has a past medical history of Headache.    Medications reviewed and updated by myself where necessary, and exist elsewhere in the encounter.   Connie Bryan has No Known Allergies. She  reports that she has been smoking Cigarettes.  She has been smoking about 0.25 packs per day. She has never used smokeless tobacco. She reports that she does not drink alcohol or use illicit drugs. She  reports that she currently engages in sexual activity and has had female partners. The patient  has past surgical history that includes Cesarean section; Cholecystectomy; and Dilation and curettage of uterus (N/A, 03/05/2014).  Her family history includes Cancer in her father and maternal grandmother.  Review of Systems  Constitutional: Negative for fever and chills.  Respiratory: Negative for shortness of breath.   Cardiovascular: Negative for chest pain.  Gastrointestinal: Negative for nausea and abdominal pain.  Genitourinary: Negative.   Skin: Negative for rash.  Neurological: Negative for dizziness and headaches.    OBJECTIVE  Her  height is 5\' 8"  (1.727 m) and weight is 237 lb (107.502 kg). Her oral temperature is 98.7 F (37.1 C). Her blood pressure is 102/64 and her pulse is 83. Her respiration is 16 and oxygen saturation is 97%.  The patient's body mass index is 36.04 kg/(m^2).  Physical Exam    Constitutional: She is oriented to person, place, and time. She appears well-developed and well-nourished. No distress.  Eyes: Pupils are equal, round, and reactive to light.  Cardiovascular: Normal rate.   Respiratory: Effort normal. No respiratory distress.  GI: She exhibits no distension.  Neurological: She is alert and oriented to person, place, and time.  Skin: Skin is warm and dry. She is not diaphoretic.  Psychiatric: She has a normal mood and affect. Her behavior is normal. Judgment and thought content normal.    Results for orders placed or performed in visit on 05/09/15 (from the past 24 hour(s))  POCT urine pregnancy     Status: None   Collection Time: 05/09/15  1:23 PM  Result Value Ref Range   Preg Test, Ur Negative Negative    ASSESSMENT & PLAN  Connie Bryan was seen today for possible pregnancy.  Diagnoses and all orders for this visit:  Absence of menstruation: Urine run twice and negative.  Second test run after control. Advised that she follow up with her GYN for contraception options.  -     POCT urine pregnancy    The patient was advised to call or come back to clinic if she does not see an improvement in symptoms, or worsens with the above plan.   Connie Bryan, MHS, PA-C [facility] 05/09/2015 1:27 PM

## 2015-05-14 NOTE — Progress Notes (Signed)
  Medical screening examination/treatment/procedure(s) were performed by non-physician practitioner and as supervising physician I was immediately available for consultation/collaboration.     

## 2016-02-10 ENCOUNTER — Emergency Department (HOSPITAL_COMMUNITY)
Admission: EM | Admit: 2016-02-10 | Discharge: 2016-02-11 | Disposition: A | Discharge status: Home / Self Care | Payer: Medicaid Other | Attending: Emergency Medicine | Admitting: Emergency Medicine

## 2016-02-10 ENCOUNTER — Emergency Department (HOSPITAL_COMMUNITY): Payer: Medicaid Other

## 2016-02-10 ENCOUNTER — Encounter (HOSPITAL_COMMUNITY): Payer: Self-pay | Admitting: *Deleted

## 2016-02-10 DIAGNOSIS — Z791 Long term (current) use of non-steroidal anti-inflammatories (NSAID): Secondary | ICD-10-CM | POA: Insufficient documentation

## 2016-02-10 DIAGNOSIS — Z5181 Encounter for therapeutic drug level monitoring: Secondary | ICD-10-CM | POA: Insufficient documentation

## 2016-02-10 DIAGNOSIS — F17210 Nicotine dependence, cigarettes, uncomplicated: Secondary | ICD-10-CM | POA: Insufficient documentation

## 2016-02-10 DIAGNOSIS — S62637A Displaced fracture of distal phalanx of left little finger, initial encounter for closed fracture: Secondary | ICD-10-CM | POA: Diagnosis not present

## 2016-02-10 DIAGNOSIS — Y929 Unspecified place or not applicable: Secondary | ICD-10-CM | POA: Insufficient documentation

## 2016-02-10 DIAGNOSIS — Y999 Unspecified external cause status: Secondary | ICD-10-CM | POA: Insufficient documentation

## 2016-02-10 DIAGNOSIS — Y9389 Activity, other specified: Secondary | ICD-10-CM | POA: Diagnosis not present

## 2016-02-10 DIAGNOSIS — S62609A Fracture of unspecified phalanx of unspecified finger, initial encounter for closed fracture: Secondary | ICD-10-CM

## 2016-02-10 DIAGNOSIS — IMO0002 Reserved for concepts with insufficient information to code with codable children: Secondary | ICD-10-CM

## 2016-02-10 DIAGNOSIS — S6992XA Unspecified injury of left wrist, hand and finger(s), initial encounter: Secondary | ICD-10-CM | POA: Diagnosis present

## 2016-02-10 HISTORY — DX: Migraine, unspecified, not intractable, without status migrainosus: G43.909

## 2016-02-10 HISTORY — DX: Dorsalgia, unspecified: M54.9
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
## 2016-02-11 LAB — COMPREHENSIVE METABOLIC PANEL
ALK PHOS: 76 U/L (ref 38–126)
ALT: 15 U/L (ref 14–54)
ANION GAP: 8 (ref 5–15)
AST: 15 U/L (ref 15–41)
Albumin: 5 g/dL (ref 3.5–5.0)
BILIRUBIN TOTAL: 0.4 mg/dL (ref 0.3–1.2)
BUN: 20 mg/dL (ref 6–20)
CO2: 24 mmol/L (ref 22–32)
Calcium: 9.8 mg/dL (ref 8.9–10.3)
Chloride: 105 mmol/L (ref 101–111)
Creatinine, Ser: 0.92 mg/dL (ref 0.44–1.00)
GFR calc Af Amer: >60 mL/min (ref >60)
GFR calc non Af Amer: >60 mL/min (ref >60)
Glucose, Bld: 111 mg/dL — ABNORMAL HIGH (ref 65–99)
Potassium: 4.1 mmol/L (ref 3.5–5.1)
Sodium: 137 mmol/L (ref 135–145)
TOTAL PROTEIN: 8.6 g/dL — AB (ref 6.5–8.1)

## 2016-02-11 LAB — CBC
HEMATOCRIT: 43.8 % (ref 36.0–46.0)
Hemoglobin: 14.6 g/dL (ref 12.0–15.0)
MCH: 30.4 pg (ref 26.0–34.0)
MCHC: 33.3 g/dL (ref 30.0–36.0)
MCV: 91.1 fL (ref 78.0–100.0)
PLATELETS: 419 10*3/uL — AB (ref 150–400)
RBC: 4.81 MIL/uL (ref 3.87–5.11)
RDW: 13.5 % (ref 11.5–15.5)
WBC: 12.3 10*3/uL — ABNORMAL HIGH (ref 4.0–10.5)

## 2016-02-11 LAB — RAPID URINE DRUG SCREEN, HOSP PERFORMED
Amphetamines: NOT DETECTED
BARBITURATES: NOT DETECTED
Benzodiazepines: NOT DETECTED
Cocaine: NOT DETECTED
Opiates: NOT DETECTED
Tetrahydrocannabinol: NOT DETECTED

## 2016-02-11 LAB — SALICYLATE LEVEL: Salicylate Lvl: <4 mg/dL (ref 2.8–30.0)

## 2016-02-11 LAB — I-STAT BETA HCG BLOOD, ED (MC, WL, AP ONLY)

## 2016-02-11 LAB — ETHANOL: Alcohol, Ethyl (B): <5 mg/dL (ref <5)

## 2016-02-11 LAB — ACETAMINOPHEN LEVEL

## 2016-02-11 MED ORDER — TRAMADOL HCL 50 MG PO TABS: 50.0000 mg | ORAL_TABLET | Freq: Four times a day (QID) | ORAL | Status: AC | PRN

## 2016-02-11 MED ORDER — ACETAMINOPHEN 325 MG PO TABS
650.0000 mg | ORAL_TABLET | Freq: Once | ORAL | Status: AC
  Administered 2016-02-11: 650 mg via ORAL
  Filled 2016-02-11: qty 2

## 2016-02-11 MED ORDER — TRAMADOL HCL 50 MG PO TABS
100.0000 mg | ORAL_TABLET | Freq: Once | ORAL | Status: AC
  Administered 2016-02-11: 100 mg via ORAL
  Filled 2016-02-11: qty 2

## 2016-02-11 MED ORDER — TRAMADOL HCL 50 MG PO TABS
50.0000 mg | ORAL_TABLET | Freq: Once | ORAL | Status: AC
  Administered 2016-02-11: 50 mg via ORAL
  Filled 2016-02-11: qty 1
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
## 2016-02-11 NOTE — ED Notes (Signed)
[employer] finished. Pt leaving with friend. Counselor at bedside.

## 2016-02-11 NOTE — Discharge Instructions (Signed)
Wear finger splint as applied.  Keep your finger iced for 20 minutes of every 2 hours for the next 2 days.  Tramadol as prescribed as needed for pain not relieved with ibuprofen.  Follow-up with Dr. Burney Gauze to have your finger fracture evaluated. His contact information has been provided in this discharge summary for you to call and make these arrangements.   Finger Fracture Fractures of fingers are breaks in the bones of the fingers. There are many types of fractures. There are different ways of treating these fractures. Your health care provider will discuss the best way to treat your fracture. CAUSES Traumatic injury is the main cause of broken fingers. These include:  Injuries while playing sports.  Workplace injuries.  Falls. RISK FACTORS Activities that can increase your risk of finger fractures include:  Sports.  Workplace activities that involve machinery.  A condition called osteoporosis, which can make your bones less dense and cause them to fracture more easily. SIGNS AND SYMPTOMS The main symptoms of a broken finger are pain and swelling within 15 minutes after the injury. Other symptoms include:  Bruising of your finger.  Stiffness of your finger.  Numbness of your finger.  Exposed bones (compound fracture) if the fracture is severe. DIAGNOSIS  The best way to diagnose a broken bone is with X-ray imaging. Additionally, your health care provider will use this X-ray image to evaluate the position of the broken finger bones.  TREATMENT  Finger fractures can be treated with:   Nonreduction--This means the bones are in place. The finger is splinted without changing the positions of the bone pieces. The splint is usually left on for about a week to 10 days. This will depend on your fracture and what your health care provider thinks.  Closed reduction--The bones are put back into position without using surgery. The finger is then splinted.  Open reduction and  internal fixation--The fracture site is opened. Then the bone pieces are fixed into place with pins or some type of hardware. This is seldom required. It depends on the severity of the fracture. HOME CARE INSTRUCTIONS   Follow your health care provider's instructions regarding activities, exercises, and physical therapy.  Only take over-the-counter or prescription medicines for pain, discomfort, or fever as directed by your health care provider. SEEK MEDICAL CARE IF: You have pain or swelling that limits the motion or use of your fingers. SEEK IMMEDIATE MEDICAL CARE IF:  Your finger becomes numb. MAKE SURE YOU:   Understand these instructions.  Will watch your condition.  Will get help right away if you are not doing well or get worse.   This information is not intended to replace advice given to you by your health care provider. Make sure you discuss any questions you have with your health care provider.   Document Released: 10/21/2000 Document Revised: 04/29/2013 Document Reviewed: 02/18/2013 Elsevier Interactive Patient Education [employer].
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
## 2016-02-11 NOTE — ED Notes (Signed)
[facility] case worker at bedside
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
## 2016-03-09 ENCOUNTER — Encounter (HOSPITAL_COMMUNITY): Payer: Self-pay

## 2016-03-09 ENCOUNTER — Emergency Department (HOSPITAL_COMMUNITY): Payer: Medicaid Other

## 2016-03-09 ENCOUNTER — Emergency Department (HOSPITAL_COMMUNITY)
Admission: EM | Admit: 2016-03-09 | Discharge: 2016-03-09 | Disposition: A | Discharge status: Home / Self Care | Payer: Medicaid Other | Attending: Emergency Medicine | Admitting: Emergency Medicine

## 2016-03-09 DIAGNOSIS — F17210 Nicotine dependence, cigarettes, uncomplicated: Secondary | ICD-10-CM | POA: Insufficient documentation

## 2016-03-09 DIAGNOSIS — S62627G Displaced fracture of medial phalanx of left little finger, subsequent encounter for fracture with delayed healing: Secondary | ICD-10-CM | POA: Diagnosis not present

## 2016-03-09 DIAGNOSIS — X58XXXD Exposure to other specified factors, subsequent encounter: Secondary | ICD-10-CM | POA: Diagnosis not present

## 2016-03-09 DIAGNOSIS — S62609G Fracture of unspecified phalanx of unspecified finger, subsequent encounter for fracture with delayed healing: Secondary | ICD-10-CM

## 2016-03-09 DIAGNOSIS — M79645 Pain in left finger(s): Secondary | ICD-10-CM | POA: Diagnosis present

## 2016-03-09 NOTE — Discharge Instructions (Signed)
Please call the hand surgeon for follow up
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
## 2016-03-09 NOTE — ED Provider Notes (Signed)
[facility] DEPT Provider Note   CSN: [id number] Arrival date & time: 03/09/16  [id number]     History   Chief Complaint Chief Complaint  Patient presents with  . Hand Pain    HPI Connie Bryan is a 45 y.o. female.  HPI Patient is diagnosed with middle phalanx fracture of her left little finger on 02/10/2016.  She never followed up with hand surgery.  She's been in a splint since then.  She reports ongoing discomfort and pain in her left little finger which prompts her visit today.  She's been taking tramadol with some improvement in her pain.  She denies any new symptoms.   Past Medical History:  Diagnosis Date  . Back pain   . Headache   . Migraine    tension    Patient Active Problem List   Diagnosis Date Noted  . Abortion, missed 03/05/2014  . Lumbar spondylosis-with chronic pain 06/09/2013  . Menorrhagia 06/09/2013  . H/O cesarean section X 3 06/09/2013    Past Surgical History:  Procedure Laterality Date  . CESAREAN SECTION    . CHOLECYSTECTOMY    . DILATION AND CURETTAGE OF UTERUS N/A 03/05/2014   Procedure: DILATATION AND EVACUATION;  Surgeon: Mora Bellman, MD;  Location: [facility];  Service: Gynecology;  Laterality: N/A;    OB History    Gravida Para Term Preterm AB Living   5 4 4  0 1 4   SAB TAB Ectopic Multiple Live Births   1 0 0 0         Home Medications    Prior to Admission medications   Medication Sig Start Date End Date Taking? Authorizing Provider  acetaminophen (TYLENOL) 500 MG tablet Take 500 mg by mouth every 6 (six) hours as needed (for pain.).   Yes Historical Provider, MD  topiramate (TOPAMAX) 50 MG tablet Take 100 mg by mouth at bedtime. 01/08/16  Yes Historical Provider, MD  traMADol (ULTRAM) 50 MG tablet Take 1 tablet (50 mg total) by mouth every 6 (six) hours as needed. 02/11/16  Yes Veryl Speak, MD  vitamin B-12 (CYANOCOBALAMIN) 1000 MCG tablet Take 1,000 mcg by mouth daily.   Yes Historical Provider, MD    Family History Family  History  Problem Relation Age of Onset  . Cancer Father     prostate  . Cancer Maternal Grandmother     unknown    Social History Social History  Substance Use Topics  . Smoking status: Current Some Day Smoker    Packs/day: 0.15    Types: Cigarettes  . Smokeless tobacco: Never Used  . Alcohol use 0.0 oz/week     Comment: occasional     Allergies   Gabapentin   Review of Systems Review of Systems  All other systems reviewed and are negative.    Physical Exam Updated Vital Signs BP 107/64 (BP Location: Right Arm)   Pulse 85   Temp 98.7 F (37.1 C) (Oral)   Resp 16   LMP 02/05/2016   SpO2 100%   Physical Exam  Constitutional: She is oriented to person, place, and time. She appears well-developed and well-nourished.  HENT:  Head: Normocephalic.  Eyes: EOM are normal.  Neck: Normal range of motion.  Pulmonary/Chest: Effort normal.  Abdominal: She exhibits no distension.  Musculoskeletal:  Mild swelling and tenderness of the left little finger with the majority of tenderness focused around the left middle phalanx.  Pain with range of motion of the PIP and DIP joint of the left  little finger  Neurological: She is alert and oriented to person, place, and time.  Psychiatric: She has a normal mood and affect.  Nursing note and vitals reviewed.    ED Treatments / Results  Labs (all labs ordered are listed, but only abnormal results are displayed) Labs Reviewed - No data to display  EKG  EKG Interpretation None       Radiology Dg Finger Little Left  Result Date: 03/09/2016 CLINICAL DATA:  Persistent pain involving the left pinky finger. EXAM: LEFT LITTLE FINGER 2+V COMPARISON:  Left fifth digit radiographs - 02/10/2016 FINDINGS: Re- demonstrated minimally displaced obliquely oriented fracture involving the distal epiphysis of the middle phalanx of the fifth digit without significant interval callus formation. The fracture again extends to involve the DIP  joint. The fracture fragments are displaced by approximately 1 mm, similar to the 02/10/2016 examination. No additional fractures identified. Expected adjacent soft tissue swelling. No radiopaque foreign body. IMPRESSION: No significant change in appearance and displacement of known obliquely oriented fracture involving the distal epiphysis of the middle phalanx of the fifth digit with intra-articular extension. Electronically Signed   By: Sandi Mariscal M.D.   On: 03/09/2016 09:11    Procedures Procedures (including critical care time)  Medications Ordered in ED Medications - No data to display   Initial Impression / Assessment and Plan / ED Course  I have reviewed the triage vital signs and the nursing notes.  Pertinent labs & imaging results that were available during my care of the patient were reviewed by me and considered in my medical decision making (see chart for details).  Clinical Course   Delayed healing.  Buddy tape.  Orthopedic hand surgery follow-up.  Ibuprofen and Tylenol for pain    Final Clinical Impressions(s) / ED Diagnoses   Final diagnoses:  Finger fracture, left, with delayed healing, subsequent encounter    New Prescriptions New Prescriptions   No medications on file     Jola Schmidt, MD 03/09/16 1013

## 2016-10-18 IMAGING — US US TRANSVAGINAL NON-OB
1 series · 14 of 25 positions shown · non-contrast
Comparison: 10/02/2013

CLINICAL DATA: Metromenorrhagia

EXAM:
TRANSABDOMINAL AND TRANSVAGINAL ULTRASOUND OF PELVIS
TECHNIQUE: Both transabdominal and transvaginal ultrasound examinations of the
pelvis were performed. Transabdominal technique was performed for
global imaging of the pelvis including uterus, ovaries, adnexal
regions, and pelvic cul-de-sac. It was necessary to proceed with
endovaginal exam following the transabdominal exam to visualize the
uterus, endometrium and ovaries.

[Series 1: us pelvis complete · 14 of 38 slices shown]
[im 1/38]
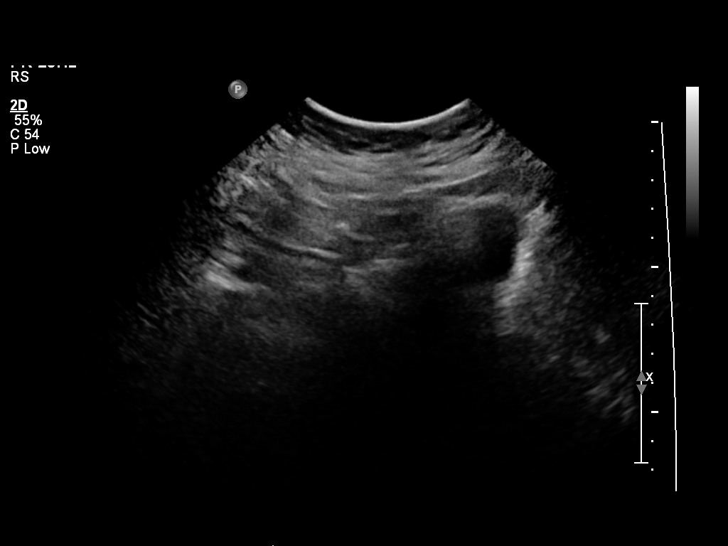
[im 4/38]
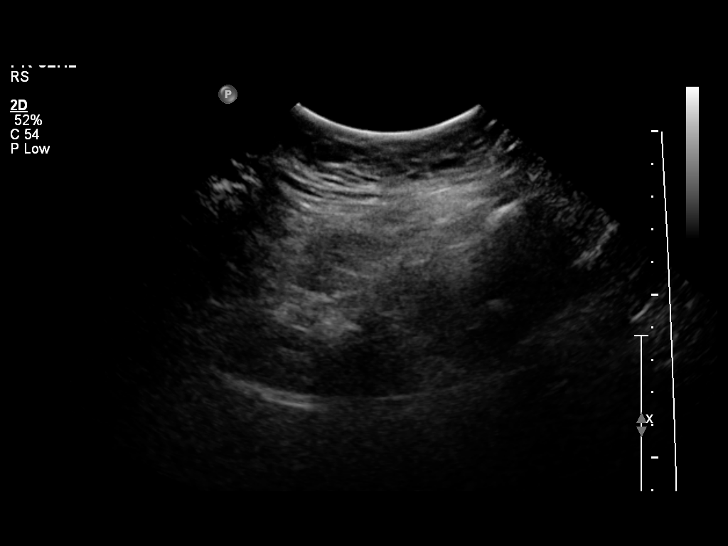
[im 7/38]
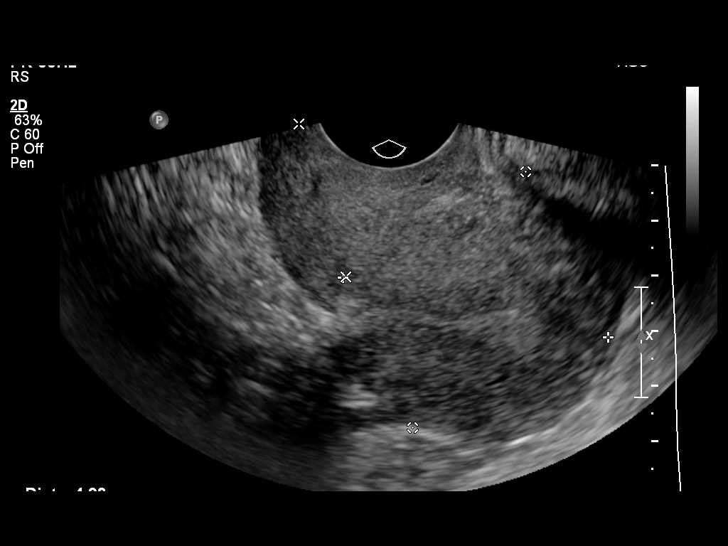
[im 10/38]
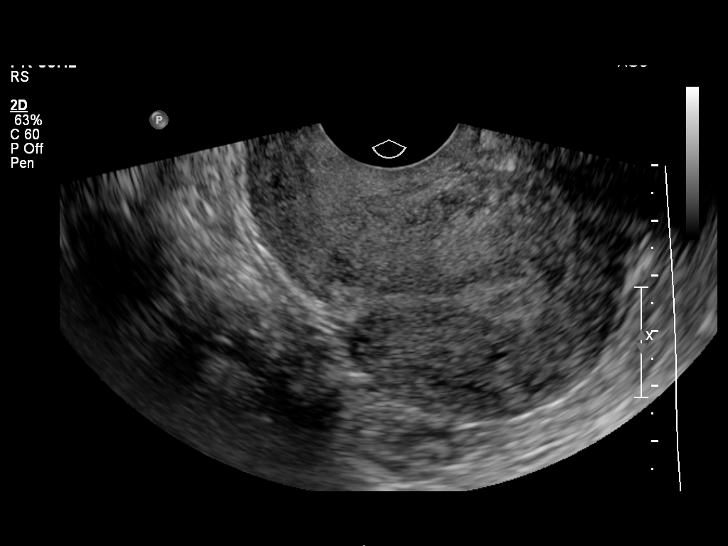
[im 13/38]
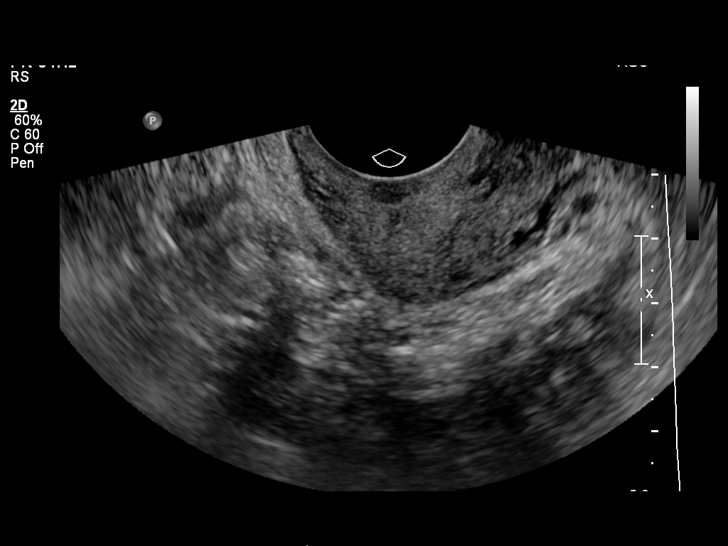
[im 14/38]
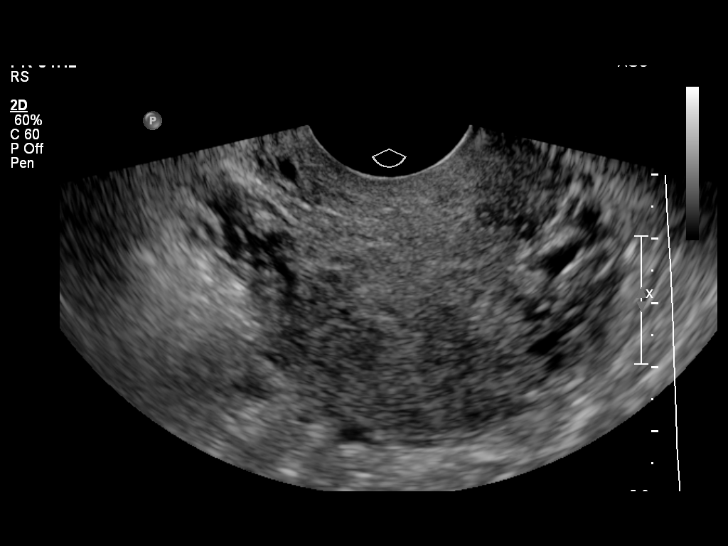
[im 17/38]
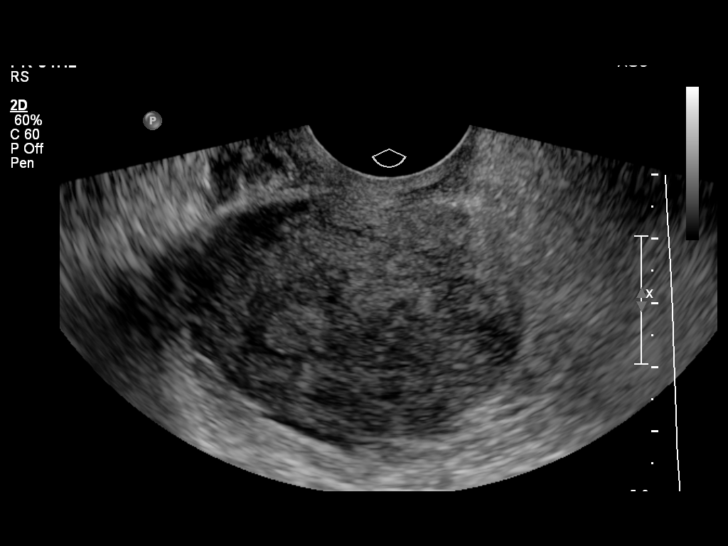
[im 21/38]
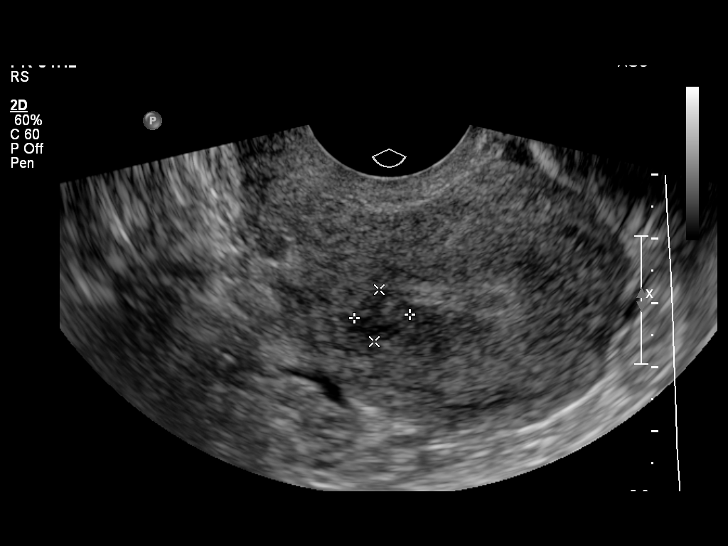
[im 24/38]
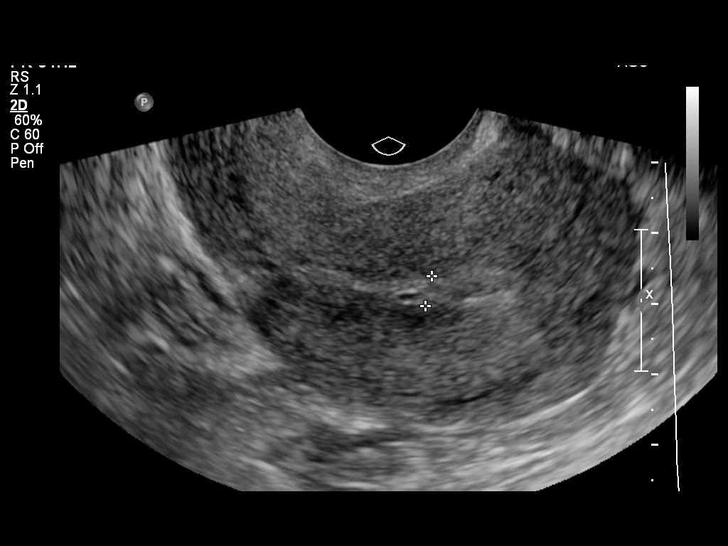
[im 25/38]
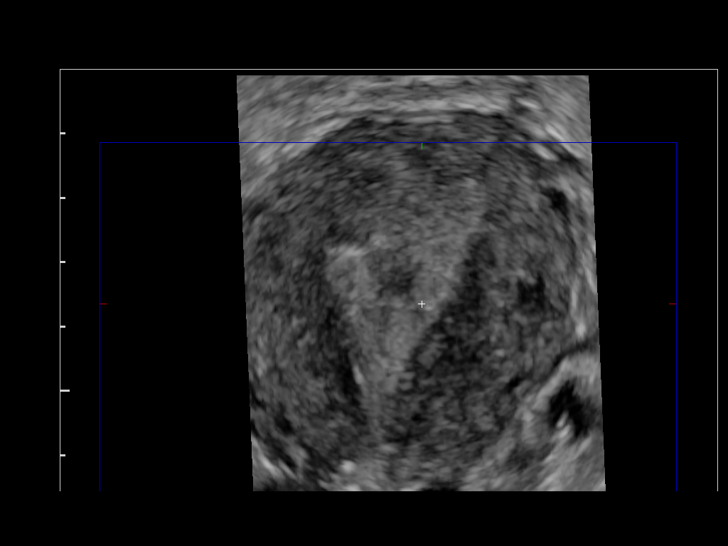
[im 28/38]
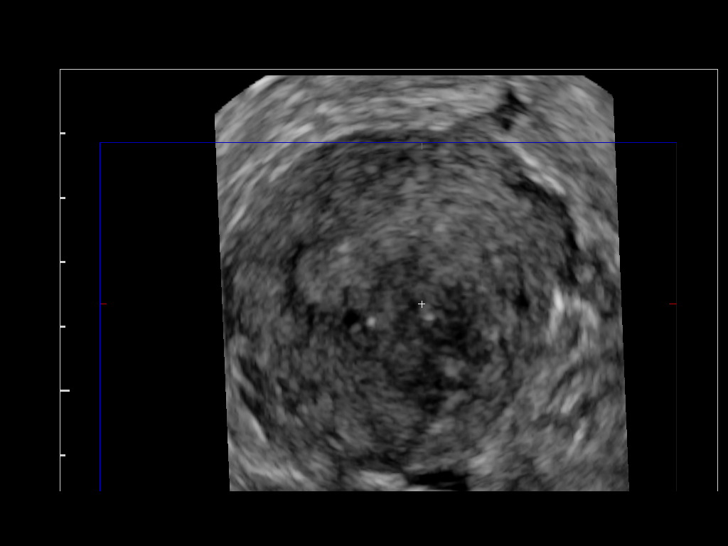
[im 31/38]
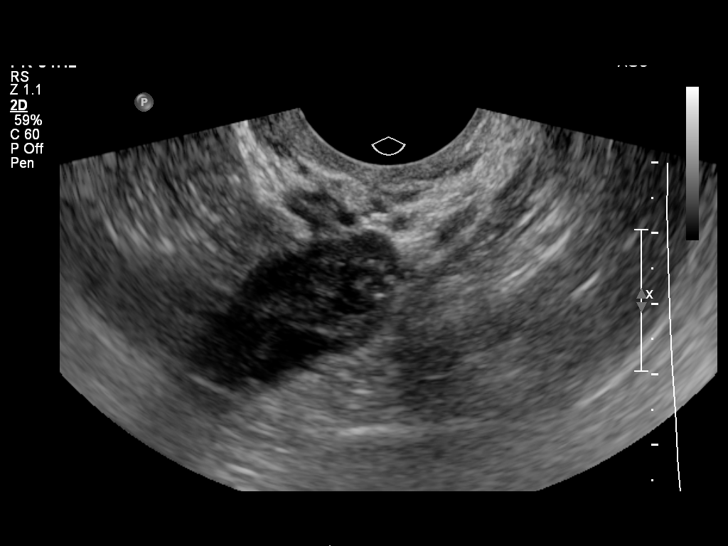
[im 34/38]
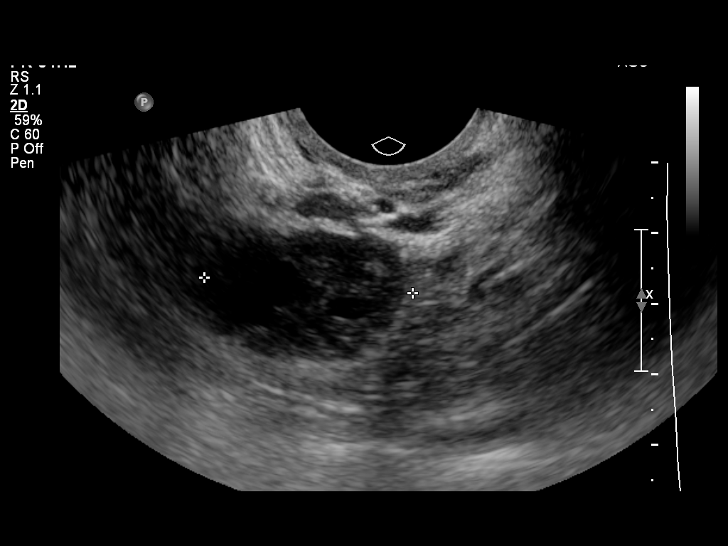
[im 38/38]
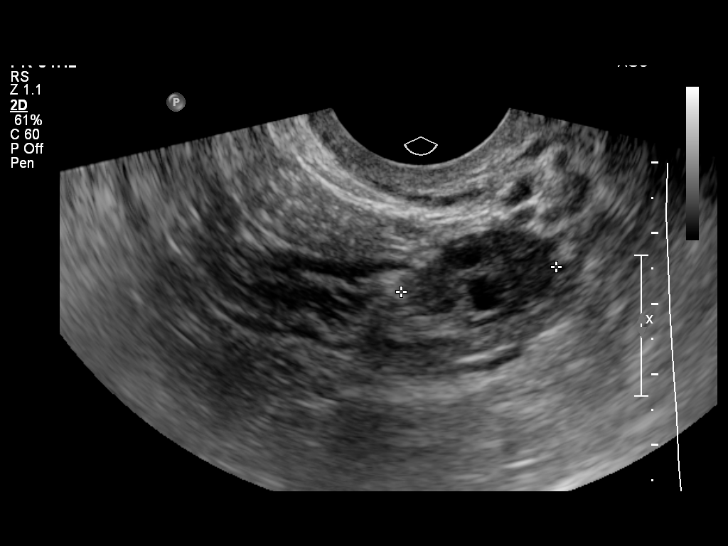

[14 of 25 positions shown; findings below may reference images not displayed]

FINDINGS: Uterus

Measurements: 7.8 x 5.1 x 6.1 cm. Small submucosal fibroid
anteriorly measuring 1.0 x 0.9 x 0.8 cm.

Endometrium

Thickness: Normal thickness, 4 mm.. No focal abnormality visualized.

Right ovary

Measurements: 3.0 x 1.8 x 3.0 cm. Normal appearance/no adnexal mass.

Left ovary

Measurements: 2.0 x 1.3 x 2.2 cm. Normal appearance/no adnexal mass.

Other findings

No free fluid.
IMPRESSION: Retroverted uterus. 10 mm anterior submucosal fibroid. Otherwise
unremarkable study.
